# Patient Record
Sex: Male | Born: 1945 | Race: White | Hispanic: No | Marital: Married | Smoking: Former smoker
Health system: Southern US, Community
[De-identification: ages and names within clinical notes are randomized; demographics above are authoritative.]

## PROBLEM LIST (undated history)

## (undated) DIAGNOSIS — M62830 Muscle spasm of back: Secondary | ICD-10-CM

## (undated) HISTORY — PX: KIDNEY STONE SURGERY: SHX686

## (undated) HISTORY — DX: Muscle spasm of back: M62.830

---

## 2000-01-04 DIAGNOSIS — Z860101 Personal history of adenomatous and serrated colon polyps: Secondary | ICD-10-CM

## 2000-01-04 DIAGNOSIS — Z86010 Personal history of colonic polyps: Secondary | ICD-10-CM

## 2000-01-04 HISTORY — DX: Personal history of adenomatous and serrated colon polyps: Z86.0101

## 2000-01-04 HISTORY — DX: Personal history of colonic polyps: Z86.010

## 2000-01-08 DIAGNOSIS — E139 Other specified diabetes mellitus without complications: Secondary | ICD-10-CM

## 2000-01-08 HISTORY — DX: Other specified diabetes mellitus without complications: E13.9

## 2005-09-20 ENCOUNTER — Ambulatory Visit (HOSPITAL_COMMUNITY): Admission: RE | Admit: 2005-09-20 | Discharge: 2005-09-21 | Payer: Self-pay | Admitting: Ophthalmology

## 2011-01-27 DIAGNOSIS — L259 Unspecified contact dermatitis, unspecified cause: Secondary | ICD-10-CM | POA: Diagnosis not present

## 2011-01-27 DIAGNOSIS — L301 Dyshidrosis [pompholyx]: Secondary | ICD-10-CM | POA: Diagnosis not present

## 2011-03-28 DIAGNOSIS — L738 Other specified follicular disorders: Secondary | ICD-10-CM | POA: Diagnosis not present

## 2011-03-28 DIAGNOSIS — L301 Dyshidrosis [pompholyx]: Secondary | ICD-10-CM | POA: Diagnosis not present

## 2011-05-27 DIAGNOSIS — I10 Essential (primary) hypertension: Secondary | ICD-10-CM | POA: Diagnosis not present

## 2011-05-27 DIAGNOSIS — E7800 Pure hypercholesterolemia, unspecified: Secondary | ICD-10-CM | POA: Diagnosis not present

## 2011-05-27 DIAGNOSIS — E119 Type 2 diabetes mellitus without complications: Secondary | ICD-10-CM | POA: Diagnosis not present

## 2011-05-27 DIAGNOSIS — G43009 Migraine without aura, not intractable, without status migrainosus: Secondary | ICD-10-CM | POA: Diagnosis not present

## 2011-07-22 DIAGNOSIS — E119 Type 2 diabetes mellitus without complications: Secondary | ICD-10-CM | POA: Diagnosis not present

## 2011-07-22 DIAGNOSIS — E7800 Pure hypercholesterolemia, unspecified: Secondary | ICD-10-CM | POA: Diagnosis not present

## 2011-08-17 DIAGNOSIS — B351 Tinea unguium: Secondary | ICD-10-CM | POA: Diagnosis not present

## 2011-08-17 DIAGNOSIS — L608 Other nail disorders: Secondary | ICD-10-CM | POA: Diagnosis not present

## 2011-08-17 DIAGNOSIS — E119 Type 2 diabetes mellitus without complications: Secondary | ICD-10-CM | POA: Diagnosis not present

## 2011-10-31 DIAGNOSIS — J069 Acute upper respiratory infection, unspecified: Secondary | ICD-10-CM | POA: Diagnosis not present

## 2012-07-05 DIAGNOSIS — I4949 Other premature depolarization: Secondary | ICD-10-CM | POA: Diagnosis not present

## 2012-07-05 DIAGNOSIS — E7800 Pure hypercholesterolemia, unspecified: Secondary | ICD-10-CM | POA: Diagnosis not present

## 2012-07-05 DIAGNOSIS — I10 Essential (primary) hypertension: Secondary | ICD-10-CM | POA: Diagnosis not present

## 2012-07-05 DIAGNOSIS — Z125 Encounter for screening for malignant neoplasm of prostate: Secondary | ICD-10-CM | POA: Diagnosis not present

## 2012-08-01 DIAGNOSIS — R0789 Other chest pain: Secondary | ICD-10-CM | POA: Diagnosis not present

## 2012-08-01 DIAGNOSIS — I4949 Other premature depolarization: Secondary | ICD-10-CM | POA: Diagnosis not present

## 2012-08-10 DIAGNOSIS — I4949 Other premature depolarization: Secondary | ICD-10-CM | POA: Diagnosis not present

## 2012-08-10 DIAGNOSIS — Z0000 Encounter for general adult medical examination without abnormal findings: Secondary | ICD-10-CM | POA: Diagnosis not present

## 2012-08-22 DIAGNOSIS — I10 Essential (primary) hypertension: Secondary | ICD-10-CM | POA: Diagnosis not present

## 2012-08-22 DIAGNOSIS — Z833 Family history of diabetes mellitus: Secondary | ICD-10-CM | POA: Diagnosis not present

## 2012-08-22 DIAGNOSIS — Z8349 Family history of other endocrine, nutritional and metabolic diseases: Secondary | ICD-10-CM | POA: Diagnosis not present

## 2012-08-22 DIAGNOSIS — H10029 Other mucopurulent conjunctivitis, unspecified eye: Secondary | ICD-10-CM | POA: Diagnosis not present

## 2012-08-22 DIAGNOSIS — Z789 Other specified health status: Secondary | ICD-10-CM | POA: Diagnosis not present

## 2012-08-22 DIAGNOSIS — I4949 Other premature depolarization: Secondary | ICD-10-CM | POA: Diagnosis not present

## 2012-08-22 DIAGNOSIS — E119 Type 2 diabetes mellitus without complications: Secondary | ICD-10-CM | POA: Diagnosis not present

## 2012-08-22 DIAGNOSIS — Z8249 Family history of ischemic heart disease and other diseases of the circulatory system: Secondary | ICD-10-CM | POA: Diagnosis not present

## 2012-08-22 DIAGNOSIS — E785 Hyperlipidemia, unspecified: Secondary | ICD-10-CM | POA: Diagnosis not present

## 2012-09-07 DIAGNOSIS — R011 Cardiac murmur, unspecified: Secondary | ICD-10-CM | POA: Diagnosis not present

## 2012-09-19 DIAGNOSIS — E119 Type 2 diabetes mellitus without complications: Secondary | ICD-10-CM | POA: Diagnosis not present

## 2012-09-19 DIAGNOSIS — I4949 Other premature depolarization: Secondary | ICD-10-CM | POA: Diagnosis not present

## 2012-09-19 DIAGNOSIS — E785 Hyperlipidemia, unspecified: Secondary | ICD-10-CM | POA: Diagnosis not present

## 2012-09-19 DIAGNOSIS — I10 Essential (primary) hypertension: Secondary | ICD-10-CM | POA: Diagnosis not present

## 2012-09-19 DIAGNOSIS — E669 Obesity, unspecified: Secondary | ICD-10-CM | POA: Diagnosis not present

## 2012-09-25 DIAGNOSIS — M75120 Complete rotator cuff tear or rupture of unspecified shoulder, not specified as traumatic: Secondary | ICD-10-CM | POA: Diagnosis not present

## 2012-09-25 DIAGNOSIS — M7500 Adhesive capsulitis of unspecified shoulder: Secondary | ICD-10-CM | POA: Diagnosis not present

## 2012-10-22 DIAGNOSIS — M549 Dorsalgia, unspecified: Secondary | ICD-10-CM | POA: Diagnosis not present

## 2012-10-31 DIAGNOSIS — Z23 Encounter for immunization: Secondary | ICD-10-CM | POA: Diagnosis not present

## 2012-11-12 DIAGNOSIS — M75120 Complete rotator cuff tear or rupture of unspecified shoulder, not specified as traumatic: Secondary | ICD-10-CM | POA: Diagnosis not present

## 2012-11-15 DIAGNOSIS — M19019 Primary osteoarthritis, unspecified shoulder: Secondary | ICD-10-CM | POA: Diagnosis not present

## 2012-11-15 DIAGNOSIS — S43439A Superior glenoid labrum lesion of unspecified shoulder, initial encounter: Secondary | ICD-10-CM | POA: Diagnosis not present

## 2012-11-15 DIAGNOSIS — M75120 Complete rotator cuff tear or rupture of unspecified shoulder, not specified as traumatic: Secondary | ICD-10-CM | POA: Diagnosis not present

## 2012-11-19 DIAGNOSIS — IMO0002 Reserved for concepts with insufficient information to code with codable children: Secondary | ICD-10-CM | POA: Diagnosis not present

## 2012-11-19 DIAGNOSIS — M7500 Adhesive capsulitis of unspecified shoulder: Secondary | ICD-10-CM | POA: Diagnosis not present

## 2012-11-19 DIAGNOSIS — M25519 Pain in unspecified shoulder: Secondary | ICD-10-CM | POA: Diagnosis not present

## 2012-12-12 DIAGNOSIS — R109 Unspecified abdominal pain: Secondary | ICD-10-CM | POA: Diagnosis not present

## 2012-12-12 DIAGNOSIS — N200 Calculus of kidney: Secondary | ICD-10-CM | POA: Diagnosis not present

## 2012-12-12 DIAGNOSIS — R1084 Generalized abdominal pain: Secondary | ICD-10-CM | POA: Diagnosis not present

## 2012-12-12 DIAGNOSIS — R3129 Other microscopic hematuria: Secondary | ICD-10-CM | POA: Diagnosis not present

## 2013-02-20 DIAGNOSIS — E785 Hyperlipidemia, unspecified: Secondary | ICD-10-CM | POA: Diagnosis present

## 2013-02-20 DIAGNOSIS — K8000 Calculus of gallbladder with acute cholecystitis without obstruction: Secondary | ICD-10-CM | POA: Diagnosis not present

## 2013-02-20 DIAGNOSIS — G8918 Other acute postprocedural pain: Secondary | ICD-10-CM | POA: Diagnosis not present

## 2013-02-20 DIAGNOSIS — E119 Type 2 diabetes mellitus without complications: Secondary | ICD-10-CM | POA: Diagnosis not present

## 2013-02-20 DIAGNOSIS — K819 Cholecystitis, unspecified: Secondary | ICD-10-CM | POA: Diagnosis not present

## 2013-02-20 DIAGNOSIS — K55059 Acute (reversible) ischemia of intestine, part and extent unspecified: Secondary | ICD-10-CM | POA: Diagnosis not present

## 2013-02-20 DIAGNOSIS — K8020 Calculus of gallbladder without cholecystitis without obstruction: Secondary | ICD-10-CM | POA: Diagnosis not present

## 2013-02-20 DIAGNOSIS — R112 Nausea with vomiting, unspecified: Secondary | ICD-10-CM | POA: Diagnosis not present

## 2013-02-20 DIAGNOSIS — Z01818 Encounter for other preprocedural examination: Secondary | ICD-10-CM | POA: Diagnosis not present

## 2013-02-20 DIAGNOSIS — K8689 Other specified diseases of pancreas: Secondary | ICD-10-CM | POA: Diagnosis not present

## 2013-02-20 DIAGNOSIS — N039 Chronic nephritic syndrome with unspecified morphologic changes: Secondary | ICD-10-CM | POA: Diagnosis not present

## 2013-02-20 DIAGNOSIS — Z87891 Personal history of nicotine dependence: Secondary | ICD-10-CM | POA: Diagnosis not present

## 2013-02-20 DIAGNOSIS — R9431 Abnormal electrocardiogram [ECG] [EKG]: Secondary | ICD-10-CM | POA: Diagnosis not present

## 2013-02-20 DIAGNOSIS — K8010 Calculus of gallbladder with chronic cholecystitis without obstruction: Secondary | ICD-10-CM | POA: Diagnosis not present

## 2013-02-20 DIAGNOSIS — K859 Acute pancreatitis without necrosis or infection, unspecified: Secondary | ICD-10-CM | POA: Diagnosis not present

## 2013-02-20 DIAGNOSIS — IMO0001 Reserved for inherently not codable concepts without codable children: Secondary | ICD-10-CM | POA: Diagnosis not present

## 2013-02-20 DIAGNOSIS — Z7982 Long term (current) use of aspirin: Secondary | ICD-10-CM | POA: Diagnosis not present

## 2013-02-20 DIAGNOSIS — R17 Unspecified jaundice: Secondary | ICD-10-CM | POA: Diagnosis not present

## 2013-02-20 DIAGNOSIS — N189 Chronic kidney disease, unspecified: Secondary | ICD-10-CM | POA: Diagnosis present

## 2013-02-20 DIAGNOSIS — R1031 Right lower quadrant pain: Secondary | ICD-10-CM | POA: Diagnosis not present

## 2013-02-20 DIAGNOSIS — I10 Essential (primary) hypertension: Secondary | ICD-10-CM | POA: Diagnosis not present

## 2013-02-20 DIAGNOSIS — K810 Acute cholecystitis: Secondary | ICD-10-CM | POA: Diagnosis not present

## 2013-02-20 DIAGNOSIS — K769 Liver disease, unspecified: Secondary | ICD-10-CM | POA: Diagnosis not present

## 2013-02-20 DIAGNOSIS — E1159 Type 2 diabetes mellitus with other circulatory complications: Secondary | ICD-10-CM | POA: Diagnosis not present

## 2013-02-20 DIAGNOSIS — Z79899 Other long term (current) drug therapy: Secondary | ICD-10-CM | POA: Diagnosis not present

## 2013-02-20 DIAGNOSIS — I130 Hypertensive heart and chronic kidney disease with heart failure and stage 1 through stage 4 chronic kidney disease, or unspecified chronic kidney disease: Secondary | ICD-10-CM | POA: Diagnosis not present

## 2013-02-20 DIAGNOSIS — R945 Abnormal results of liver function studies: Secondary | ICD-10-CM | POA: Diagnosis not present

## 2013-02-20 DIAGNOSIS — I1310 Hypertensive heart and chronic kidney disease without heart failure, with stage 1 through stage 4 chronic kidney disease, or unspecified chronic kidney disease: Secondary | ICD-10-CM | POA: Diagnosis not present

## 2013-02-27 DIAGNOSIS — IMO0001 Reserved for inherently not codable concepts without codable children: Secondary | ICD-10-CM | POA: Diagnosis not present

## 2013-02-27 DIAGNOSIS — G5710 Meralgia paresthetica, unspecified lower limb: Secondary | ICD-10-CM | POA: Diagnosis not present

## 2013-03-18 DIAGNOSIS — I10 Essential (primary) hypertension: Secondary | ICD-10-CM | POA: Diagnosis not present

## 2013-03-18 DIAGNOSIS — E785 Hyperlipidemia, unspecified: Secondary | ICD-10-CM | POA: Diagnosis not present

## 2013-03-18 DIAGNOSIS — E119 Type 2 diabetes mellitus without complications: Secondary | ICD-10-CM | POA: Diagnosis not present

## 2013-03-18 DIAGNOSIS — E669 Obesity, unspecified: Secondary | ICD-10-CM | POA: Diagnosis not present

## 2013-04-17 DIAGNOSIS — Z79899 Other long term (current) drug therapy: Secondary | ICD-10-CM | POA: Diagnosis not present

## 2013-04-17 DIAGNOSIS — E7800 Pure hypercholesterolemia, unspecified: Secondary | ICD-10-CM | POA: Diagnosis not present

## 2013-04-17 DIAGNOSIS — IMO0001 Reserved for inherently not codable concepts without codable children: Secondary | ICD-10-CM | POA: Diagnosis not present

## 2013-10-17 DIAGNOSIS — Z23 Encounter for immunization: Secondary | ICD-10-CM | POA: Diagnosis not present

## 2013-10-23 DIAGNOSIS — Z79899 Other long term (current) drug therapy: Secondary | ICD-10-CM | POA: Diagnosis not present

## 2013-10-23 DIAGNOSIS — E780 Pure hypercholesterolemia: Secondary | ICD-10-CM | POA: Diagnosis not present

## 2013-10-23 DIAGNOSIS — I10 Essential (primary) hypertension: Secondary | ICD-10-CM | POA: Diagnosis not present

## 2013-10-23 DIAGNOSIS — E1165 Type 2 diabetes mellitus with hyperglycemia: Secondary | ICD-10-CM | POA: Diagnosis not present

## 2013-10-29 DIAGNOSIS — E119 Type 2 diabetes mellitus without complications: Secondary | ICD-10-CM | POA: Diagnosis not present

## 2013-10-29 DIAGNOSIS — E785 Hyperlipidemia, unspecified: Secondary | ICD-10-CM | POA: Diagnosis not present

## 2013-10-29 DIAGNOSIS — E669 Obesity, unspecified: Secondary | ICD-10-CM | POA: Diagnosis not present

## 2013-10-29 DIAGNOSIS — I10 Essential (primary) hypertension: Secondary | ICD-10-CM | POA: Diagnosis not present

## 2013-11-05 DIAGNOSIS — I10 Essential (primary) hypertension: Secondary | ICD-10-CM | POA: Diagnosis not present

## 2013-12-11 DIAGNOSIS — M7500 Adhesive capsulitis of unspecified shoulder: Secondary | ICD-10-CM | POA: Diagnosis not present

## 2014-08-21 DIAGNOSIS — I10 Essential (primary) hypertension: Secondary | ICD-10-CM | POA: Diagnosis not present

## 2014-08-21 DIAGNOSIS — G43009 Migraine without aura, not intractable, without status migrainosus: Secondary | ICD-10-CM | POA: Diagnosis not present

## 2014-08-21 DIAGNOSIS — E1165 Type 2 diabetes mellitus with hyperglycemia: Secondary | ICD-10-CM | POA: Diagnosis not present

## 2014-08-21 DIAGNOSIS — E780 Pure hypercholesterolemia: Secondary | ICD-10-CM | POA: Diagnosis not present

## 2014-10-22 DIAGNOSIS — M24571 Contracture, right ankle: Secondary | ICD-10-CM | POA: Diagnosis not present

## 2014-10-22 DIAGNOSIS — M7742 Metatarsalgia, left foot: Secondary | ICD-10-CM | POA: Diagnosis not present

## 2014-10-22 DIAGNOSIS — M2042 Other hammer toe(s) (acquired), left foot: Secondary | ICD-10-CM | POA: Diagnosis not present

## 2014-10-22 DIAGNOSIS — M7741 Metatarsalgia, right foot: Secondary | ICD-10-CM | POA: Diagnosis not present

## 2014-10-22 DIAGNOSIS — L84 Corns and callosities: Secondary | ICD-10-CM | POA: Diagnosis not present

## 2014-10-22 DIAGNOSIS — M2041 Other hammer toe(s) (acquired), right foot: Secondary | ICD-10-CM | POA: Diagnosis not present

## 2014-10-22 DIAGNOSIS — E1140 Type 2 diabetes mellitus with diabetic neuropathy, unspecified: Secondary | ICD-10-CM | POA: Diagnosis not present

## 2014-10-22 DIAGNOSIS — M24572 Contracture, left ankle: Secondary | ICD-10-CM | POA: Diagnosis not present

## 2014-10-27 DIAGNOSIS — I499 Cardiac arrhythmia, unspecified: Secondary | ICD-10-CM | POA: Diagnosis not present

## 2014-10-27 DIAGNOSIS — I493 Ventricular premature depolarization: Secondary | ICD-10-CM | POA: Diagnosis not present

## 2014-10-27 DIAGNOSIS — E119 Type 2 diabetes mellitus without complications: Secondary | ICD-10-CM | POA: Diagnosis not present

## 2014-10-27 DIAGNOSIS — I10 Essential (primary) hypertension: Secondary | ICD-10-CM | POA: Diagnosis not present

## 2014-11-04 DIAGNOSIS — Z23 Encounter for immunization: Secondary | ICD-10-CM | POA: Diagnosis not present

## 2014-11-05 DIAGNOSIS — M7502 Adhesive capsulitis of left shoulder: Secondary | ICD-10-CM | POA: Diagnosis not present

## 2014-12-26 DIAGNOSIS — J00 Acute nasopharyngitis [common cold]: Secondary | ICD-10-CM | POA: Diagnosis not present

## 2014-12-26 DIAGNOSIS — J209 Acute bronchitis, unspecified: Secondary | ICD-10-CM | POA: Diagnosis not present

## 2015-05-29 DIAGNOSIS — L219 Seborrheic dermatitis, unspecified: Secondary | ICD-10-CM | POA: Diagnosis not present

## 2015-05-29 DIAGNOSIS — R233 Spontaneous ecchymoses: Secondary | ICD-10-CM | POA: Diagnosis not present

## 2015-06-05 DIAGNOSIS — R42 Dizziness and giddiness: Secondary | ICD-10-CM | POA: Diagnosis not present

## 2015-07-08 DIAGNOSIS — H66009 Acute suppurative otitis media without spontaneous rupture of ear drum, unspecified ear: Secondary | ICD-10-CM | POA: Diagnosis not present

## 2015-07-14 DIAGNOSIS — H6992 Unspecified Eustachian tube disorder, left ear: Secondary | ICD-10-CM | POA: Diagnosis not present

## 2015-07-14 DIAGNOSIS — I951 Orthostatic hypotension: Secondary | ICD-10-CM | POA: Diagnosis not present

## 2015-09-04 DIAGNOSIS — G43009 Migraine without aura, not intractable, without status migrainosus: Secondary | ICD-10-CM | POA: Diagnosis not present

## 2015-09-04 DIAGNOSIS — Z0000 Encounter for general adult medical examination without abnormal findings: Secondary | ICD-10-CM | POA: Diagnosis not present

## 2015-09-04 DIAGNOSIS — E1165 Type 2 diabetes mellitus with hyperglycemia: Secondary | ICD-10-CM | POA: Diagnosis not present

## 2015-09-04 DIAGNOSIS — Z125 Encounter for screening for malignant neoplasm of prostate: Secondary | ICD-10-CM | POA: Diagnosis not present

## 2015-09-04 DIAGNOSIS — I10 Essential (primary) hypertension: Secondary | ICD-10-CM | POA: Diagnosis not present

## 2015-09-04 DIAGNOSIS — E785 Hyperlipidemia, unspecified: Secondary | ICD-10-CM | POA: Diagnosis not present

## 2015-09-04 DIAGNOSIS — Z79899 Other long term (current) drug therapy: Secondary | ICD-10-CM | POA: Diagnosis not present

## 2015-11-18 DIAGNOSIS — Z23 Encounter for immunization: Secondary | ICD-10-CM | POA: Diagnosis not present

## 2016-01-08 DIAGNOSIS — R008 Other abnormalities of heart beat: Secondary | ICD-10-CM | POA: Insufficient documentation

## 2016-01-08 HISTORY — DX: Other abnormalities of heart beat: R00.8

## 2016-02-12 DIAGNOSIS — R100 Acute abdomen: Secondary | ICD-10-CM | POA: Diagnosis not present

## 2016-02-12 DIAGNOSIS — N200 Calculus of kidney: Secondary | ICD-10-CM | POA: Diagnosis not present

## 2016-02-12 DIAGNOSIS — R311 Benign essential microscopic hematuria: Secondary | ICD-10-CM | POA: Diagnosis not present

## 2016-02-12 DIAGNOSIS — N3001 Acute cystitis with hematuria: Secondary | ICD-10-CM | POA: Diagnosis not present

## 2016-02-12 DIAGNOSIS — N401 Enlarged prostate with lower urinary tract symptoms: Secondary | ICD-10-CM | POA: Diagnosis not present

## 2016-02-23 DIAGNOSIS — Z01818 Encounter for other preprocedural examination: Secondary | ICD-10-CM | POA: Diagnosis not present

## 2016-02-23 DIAGNOSIS — Z9181 History of falling: Secondary | ICD-10-CM | POA: Diagnosis not present

## 2016-02-23 DIAGNOSIS — Z01810 Encounter for preprocedural cardiovascular examination: Secondary | ICD-10-CM | POA: Diagnosis not present

## 2016-02-23 DIAGNOSIS — Z1389 Encounter for screening for other disorder: Secondary | ICD-10-CM | POA: Diagnosis not present

## 2016-02-24 DIAGNOSIS — I2510 Atherosclerotic heart disease of native coronary artery without angina pectoris: Secondary | ICD-10-CM | POA: Diagnosis not present

## 2016-02-24 DIAGNOSIS — E119 Type 2 diabetes mellitus without complications: Secondary | ICD-10-CM | POA: Diagnosis not present

## 2016-02-24 DIAGNOSIS — Z79899 Other long term (current) drug therapy: Secondary | ICD-10-CM | POA: Diagnosis not present

## 2016-02-24 DIAGNOSIS — N3001 Acute cystitis with hematuria: Secondary | ICD-10-CM | POA: Diagnosis not present

## 2016-02-24 DIAGNOSIS — N200 Calculus of kidney: Secondary | ICD-10-CM | POA: Diagnosis not present

## 2016-02-24 DIAGNOSIS — Z7982 Long term (current) use of aspirin: Secondary | ICD-10-CM | POA: Diagnosis not present

## 2016-02-24 DIAGNOSIS — R311 Benign essential microscopic hematuria: Secondary | ICD-10-CM | POA: Diagnosis not present

## 2016-02-24 DIAGNOSIS — I10 Essential (primary) hypertension: Secondary | ICD-10-CM | POA: Diagnosis not present

## 2016-02-24 DIAGNOSIS — R100 Acute abdomen: Secondary | ICD-10-CM | POA: Diagnosis not present

## 2016-02-26 DIAGNOSIS — I499 Cardiac arrhythmia, unspecified: Secondary | ICD-10-CM | POA: Diagnosis not present

## 2016-02-26 DIAGNOSIS — E119 Type 2 diabetes mellitus without complications: Secondary | ICD-10-CM | POA: Diagnosis not present

## 2016-02-26 DIAGNOSIS — I10 Essential (primary) hypertension: Secondary | ICD-10-CM | POA: Diagnosis not present

## 2016-02-26 DIAGNOSIS — N200 Calculus of kidney: Secondary | ICD-10-CM | POA: Diagnosis not present

## 2016-03-04 DIAGNOSIS — N200 Calculus of kidney: Secondary | ICD-10-CM | POA: Diagnosis not present

## 2016-03-04 DIAGNOSIS — N3020 Other chronic cystitis without hematuria: Secondary | ICD-10-CM | POA: Diagnosis not present

## 2016-04-05 DIAGNOSIS — N200 Calculus of kidney: Secondary | ICD-10-CM | POA: Diagnosis not present

## 2016-04-05 DIAGNOSIS — N3020 Other chronic cystitis without hematuria: Secondary | ICD-10-CM | POA: Diagnosis not present

## 2016-04-07 DIAGNOSIS — I499 Cardiac arrhythmia, unspecified: Secondary | ICD-10-CM | POA: Diagnosis not present

## 2016-04-07 DIAGNOSIS — E119 Type 2 diabetes mellitus without complications: Secondary | ICD-10-CM | POA: Diagnosis not present

## 2016-04-07 DIAGNOSIS — N200 Calculus of kidney: Secondary | ICD-10-CM | POA: Diagnosis not present

## 2016-04-07 DIAGNOSIS — I10 Essential (primary) hypertension: Secondary | ICD-10-CM | POA: Diagnosis not present

## 2016-04-07 DIAGNOSIS — R001 Bradycardia, unspecified: Secondary | ICD-10-CM | POA: Diagnosis not present

## 2016-04-08 DIAGNOSIS — N200 Calculus of kidney: Secondary | ICD-10-CM | POA: Diagnosis not present

## 2016-04-08 DIAGNOSIS — I10 Essential (primary) hypertension: Secondary | ICD-10-CM | POA: Diagnosis not present

## 2016-04-08 DIAGNOSIS — E119 Type 2 diabetes mellitus without complications: Secondary | ICD-10-CM | POA: Diagnosis not present

## 2016-04-08 DIAGNOSIS — I499 Cardiac arrhythmia, unspecified: Secondary | ICD-10-CM | POA: Diagnosis not present

## 2016-04-15 DIAGNOSIS — N200 Calculus of kidney: Secondary | ICD-10-CM | POA: Diagnosis not present

## 2016-04-15 DIAGNOSIS — N3020 Other chronic cystitis without hematuria: Secondary | ICD-10-CM | POA: Diagnosis not present

## 2016-05-05 DIAGNOSIS — N3020 Other chronic cystitis without hematuria: Secondary | ICD-10-CM | POA: Diagnosis not present

## 2016-05-05 DIAGNOSIS — N200 Calculus of kidney: Secondary | ICD-10-CM | POA: Diagnosis not present

## 2016-05-31 DIAGNOSIS — Z6830 Body mass index (BMI) 30.0-30.9, adult: Secondary | ICD-10-CM | POA: Diagnosis not present

## 2016-05-31 DIAGNOSIS — R21 Rash and other nonspecific skin eruption: Secondary | ICD-10-CM | POA: Diagnosis not present

## 2016-05-31 DIAGNOSIS — W57XXXA Bitten or stung by nonvenomous insect and other nonvenomous arthropods, initial encounter: Secondary | ICD-10-CM | POA: Diagnosis not present

## 2016-07-04 DIAGNOSIS — N200 Calculus of kidney: Secondary | ICD-10-CM | POA: Diagnosis not present

## 2016-07-04 DIAGNOSIS — N3020 Other chronic cystitis without hematuria: Secondary | ICD-10-CM | POA: Diagnosis not present

## 2016-10-14 DIAGNOSIS — Z23 Encounter for immunization: Secondary | ICD-10-CM | POA: Diagnosis not present

## 2016-11-09 DIAGNOSIS — M549 Dorsalgia, unspecified: Secondary | ICD-10-CM | POA: Diagnosis not present

## 2016-12-07 DIAGNOSIS — Z6832 Body mass index (BMI) 32.0-32.9, adult: Secondary | ICD-10-CM | POA: Diagnosis not present

## 2016-12-07 DIAGNOSIS — M545 Low back pain: Secondary | ICD-10-CM | POA: Diagnosis not present

## 2017-01-02 DIAGNOSIS — Z1331 Encounter for screening for depression: Secondary | ICD-10-CM | POA: Diagnosis not present

## 2017-01-02 DIAGNOSIS — Z6832 Body mass index (BMI) 32.0-32.9, adult: Secondary | ICD-10-CM | POA: Diagnosis not present

## 2017-01-02 DIAGNOSIS — M545 Low back pain: Secondary | ICD-10-CM | POA: Diagnosis not present

## 2017-01-02 DIAGNOSIS — G8929 Other chronic pain: Secondary | ICD-10-CM | POA: Diagnosis not present

## 2017-01-10 DIAGNOSIS — N200 Calculus of kidney: Secondary | ICD-10-CM | POA: Diagnosis not present

## 2017-02-07 DIAGNOSIS — G8929 Other chronic pain: Secondary | ICD-10-CM | POA: Diagnosis not present

## 2017-02-07 DIAGNOSIS — Z87442 Personal history of urinary calculi: Secondary | ICD-10-CM | POA: Diagnosis not present

## 2017-02-07 DIAGNOSIS — M545 Low back pain: Secondary | ICD-10-CM | POA: Diagnosis not present

## 2017-02-07 DIAGNOSIS — R319 Hematuria, unspecified: Secondary | ICD-10-CM | POA: Diagnosis not present

## 2017-02-14 DIAGNOSIS — R2689 Other abnormalities of gait and mobility: Secondary | ICD-10-CM | POA: Diagnosis not present

## 2017-02-14 DIAGNOSIS — R293 Abnormal posture: Secondary | ICD-10-CM | POA: Diagnosis not present

## 2017-02-14 DIAGNOSIS — M2560 Stiffness of unspecified joint, not elsewhere classified: Secondary | ICD-10-CM | POA: Diagnosis not present

## 2017-02-14 DIAGNOSIS — M6281 Muscle weakness (generalized): Secondary | ICD-10-CM | POA: Diagnosis not present

## 2017-02-14 DIAGNOSIS — M545 Low back pain: Secondary | ICD-10-CM | POA: Diagnosis not present

## 2017-02-17 DIAGNOSIS — M6281 Muscle weakness (generalized): Secondary | ICD-10-CM | POA: Diagnosis not present

## 2017-02-17 DIAGNOSIS — R293 Abnormal posture: Secondary | ICD-10-CM | POA: Diagnosis not present

## 2017-02-17 DIAGNOSIS — M2560 Stiffness of unspecified joint, not elsewhere classified: Secondary | ICD-10-CM | POA: Diagnosis not present

## 2017-02-17 DIAGNOSIS — M545 Low back pain: Secondary | ICD-10-CM | POA: Diagnosis not present

## 2017-02-17 DIAGNOSIS — R2689 Other abnormalities of gait and mobility: Secondary | ICD-10-CM | POA: Diagnosis not present

## 2017-02-21 DIAGNOSIS — R293 Abnormal posture: Secondary | ICD-10-CM | POA: Diagnosis not present

## 2017-02-21 DIAGNOSIS — M2560 Stiffness of unspecified joint, not elsewhere classified: Secondary | ICD-10-CM | POA: Diagnosis not present

## 2017-02-21 DIAGNOSIS — M545 Low back pain: Secondary | ICD-10-CM | POA: Diagnosis not present

## 2017-02-21 DIAGNOSIS — R2689 Other abnormalities of gait and mobility: Secondary | ICD-10-CM | POA: Diagnosis not present

## 2017-02-21 DIAGNOSIS — M6281 Muscle weakness (generalized): Secondary | ICD-10-CM | POA: Diagnosis not present

## 2017-02-23 DIAGNOSIS — M6281 Muscle weakness (generalized): Secondary | ICD-10-CM | POA: Diagnosis not present

## 2017-02-23 DIAGNOSIS — R2689 Other abnormalities of gait and mobility: Secondary | ICD-10-CM | POA: Diagnosis not present

## 2017-02-23 DIAGNOSIS — M2560 Stiffness of unspecified joint, not elsewhere classified: Secondary | ICD-10-CM | POA: Diagnosis not present

## 2017-02-23 DIAGNOSIS — M545 Low back pain: Secondary | ICD-10-CM | POA: Diagnosis not present

## 2017-02-23 DIAGNOSIS — R293 Abnormal posture: Secondary | ICD-10-CM | POA: Diagnosis not present

## 2017-02-28 DIAGNOSIS — R2689 Other abnormalities of gait and mobility: Secondary | ICD-10-CM | POA: Diagnosis not present

## 2017-02-28 DIAGNOSIS — M545 Low back pain: Secondary | ICD-10-CM | POA: Diagnosis not present

## 2017-02-28 DIAGNOSIS — R293 Abnormal posture: Secondary | ICD-10-CM | POA: Diagnosis not present

## 2017-02-28 DIAGNOSIS — M6281 Muscle weakness (generalized): Secondary | ICD-10-CM | POA: Diagnosis not present

## 2017-02-28 DIAGNOSIS — M2560 Stiffness of unspecified joint, not elsewhere classified: Secondary | ICD-10-CM | POA: Diagnosis not present

## 2017-03-02 DIAGNOSIS — M545 Low back pain: Secondary | ICD-10-CM | POA: Diagnosis not present

## 2017-03-02 DIAGNOSIS — M6281 Muscle weakness (generalized): Secondary | ICD-10-CM | POA: Diagnosis not present

## 2017-03-02 DIAGNOSIS — M2560 Stiffness of unspecified joint, not elsewhere classified: Secondary | ICD-10-CM | POA: Diagnosis not present

## 2017-03-02 DIAGNOSIS — R293 Abnormal posture: Secondary | ICD-10-CM | POA: Diagnosis not present

## 2017-03-02 DIAGNOSIS — R2689 Other abnormalities of gait and mobility: Secondary | ICD-10-CM | POA: Diagnosis not present

## 2017-03-06 DIAGNOSIS — R2689 Other abnormalities of gait and mobility: Secondary | ICD-10-CM | POA: Diagnosis not present

## 2017-03-06 DIAGNOSIS — M6281 Muscle weakness (generalized): Secondary | ICD-10-CM | POA: Diagnosis not present

## 2017-03-06 DIAGNOSIS — R293 Abnormal posture: Secondary | ICD-10-CM | POA: Diagnosis not present

## 2017-03-06 DIAGNOSIS — M2560 Stiffness of unspecified joint, not elsewhere classified: Secondary | ICD-10-CM | POA: Diagnosis not present

## 2017-03-06 DIAGNOSIS — M545 Low back pain: Secondary | ICD-10-CM | POA: Diagnosis not present

## 2017-03-08 DIAGNOSIS — I493 Ventricular premature depolarization: Secondary | ICD-10-CM

## 2017-03-08 DIAGNOSIS — E119 Type 2 diabetes mellitus without complications: Secondary | ICD-10-CM | POA: Insufficient documentation

## 2017-03-08 DIAGNOSIS — E785 Hyperlipidemia, unspecified: Secondary | ICD-10-CM

## 2017-03-08 DIAGNOSIS — I10 Essential (primary) hypertension: Secondary | ICD-10-CM | POA: Insufficient documentation

## 2017-03-08 HISTORY — DX: Type 2 diabetes mellitus without complications: E11.9

## 2017-03-08 HISTORY — DX: Essential (primary) hypertension: I10

## 2017-03-08 HISTORY — DX: Ventricular premature depolarization: I49.3

## 2017-03-08 HISTORY — DX: Hyperlipidemia, unspecified: E78.5

## 2017-03-09 ENCOUNTER — Ambulatory Visit (INDEPENDENT_AMBULATORY_CARE_PROVIDER_SITE_OTHER): Payer: Medicare Other | Admitting: Cardiology

## 2017-03-09 ENCOUNTER — Encounter: Payer: Self-pay | Admitting: Cardiology

## 2017-03-09 VITALS — BP 152/90 | HR 84 | Ht 72.0 in | Wt 255.4 lb

## 2017-03-09 DIAGNOSIS — R0600 Dyspnea, unspecified: Secondary | ICD-10-CM

## 2017-03-09 DIAGNOSIS — M6281 Muscle weakness (generalized): Secondary | ICD-10-CM | POA: Diagnosis not present

## 2017-03-09 DIAGNOSIS — I10 Essential (primary) hypertension: Secondary | ICD-10-CM | POA: Diagnosis not present

## 2017-03-09 DIAGNOSIS — M2560 Stiffness of unspecified joint, not elsewhere classified: Secondary | ICD-10-CM | POA: Diagnosis not present

## 2017-03-09 DIAGNOSIS — R0609 Other forms of dyspnea: Secondary | ICD-10-CM | POA: Insufficient documentation

## 2017-03-09 DIAGNOSIS — I493 Ventricular premature depolarization: Secondary | ICD-10-CM | POA: Diagnosis not present

## 2017-03-09 DIAGNOSIS — E119 Type 2 diabetes mellitus without complications: Secondary | ICD-10-CM

## 2017-03-09 DIAGNOSIS — M545 Low back pain: Secondary | ICD-10-CM | POA: Diagnosis not present

## 2017-03-09 DIAGNOSIS — E785 Hyperlipidemia, unspecified: Secondary | ICD-10-CM | POA: Diagnosis not present

## 2017-03-09 DIAGNOSIS — R2689 Other abnormalities of gait and mobility: Secondary | ICD-10-CM | POA: Diagnosis not present

## 2017-03-09 DIAGNOSIS — R293 Abnormal posture: Secondary | ICD-10-CM | POA: Diagnosis not present

## 2017-03-09 HISTORY — DX: Other forms of dyspnea: R06.09

## 2017-03-09 HISTORY — DX: Dyspnea, unspecified: R06.00

## 2017-03-09 MED ORDER — CARVEDILOL 3.125 MG PO TABS: 3.1250 mg | ORAL_TABLET | Freq: Two times a day (BID) | ORAL | 0 refills | Status: DC

## 2017-03-09 MED ORDER — CARVEDILOL 3.125 MG PO TABS: 3.1250 mg | ORAL_TABLET | Freq: Two times a day (BID) | ORAL | 3 refills | Status: DC

## 2017-03-09 NOTE — Addendum Note (Signed)
Addended by: Ayesha MohairWELLS, Roslynn Holte E on: 03/09/2017 02:49 PM   Modules accepted: Orders

## 2017-03-09 NOTE — Patient Instructions (Signed)
Medication Instructions:  Your physician recommends that you continue on your current medications as directed. Please refer to the Current Medication list given to you today.  Labwork: Your physician recommends that you return for lab work on the same day as your stress echo. BMP, CBC, TSH, LFT, Lipid panel.  Testing/Procedures: You had an EKG today.  Your physician has requested that you have a stress echocardiogram. For further information please visit [email or web address]. Please follow instruction sheet as given.  Follow-Up: Your physician wants you to follow-up in: 6 months. You will receive a reminder letter in the mail two months in advance. If you don't receive a letter, please call our office to schedule the follow-up appointment.  Any Other Special Instructions Will Be Listed Below (If Applicable).     If you need a refill on your cardiac medications before your next appointment, please call your pharmacy.

## 2017-03-09 NOTE — Progress Notes (Signed)
Cardiology Office Note:    Date:  03/09/2017   ID:  Christian Hall, DOB [birth year], MRN [id number]  PCP:  Lise Auer, MD  Cardiologist:  Garwin Brothers, MD   Referring MD: No ref. provider found    ASSESSMENT:    1. Dyspnea on exertion   2. PVC's (premature ventricular contractions)   3. Hyperlipidemia, unspecified hyperlipidemia type   4. Benign essential hypertension   5. Ventricular premature contractions   6. Diabetes mellitus without complication (HCC)    PLAN:    In order of problems listed above:  1. In view of the above and multiple risk factors for coronary artery disease I suggested exercise stress echo and is agreeable.  He has not taken his carvedilol as he has run out of it and we will restart this.  He will be back in the next few days for an exercise stress echo.  I told him that it would be okay for him to restart his carvedilol and take it on the morning of this test.  Lipids are followed by primary care physician.  He will have blood work when he comes next for the stress echo and this will include fasting lipids. 2. Patient will be seen in follow-up appointment in 6 months or earlier if the patient has any concerns 3. I reassured him about his PVCs.   Medication Adjustments/Labs and Tests Ordered: Current medicines are reviewed at length with the patient today.  Concerns regarding medicines are outlined above.  Orders Placed This Encounter  Procedures  . Basic metabolic panel  . CBC  . TSH  . Hepatic function panel  . Lipid Profile  . EKG 12-Lead  . ECHOCARDIOGRAM STRESS TEST   Meds ordered this encounter  Medications  . carvedilol (COREG) 3.125 MG tablet    Sig: Take 1 tablet (3.125 mg total) by mouth 2 (two) times daily.    Dispense:  180 tablet    Refill:  3     History of Present Illness:    Christian Hall is a 72 y.o. male who is being seen today for the evaluation of essential hypertension and shortness of breath.  Patient is a  pleasant 72 year old male with past medical history of essential hypertension, dyslipidemia and diabetes mellitus.  He has not been seen by me in this practice.  He is completed to get established.  He denies any chest pain orthopnea or PND.  He walks on a regular basis but wants to be evaluated because of frequent PVCs in the past.  He has also some shortness of breath on exertion at times.  No orthopnea or PND.  At the time of my evaluation, the patient is alert awake oriented and in no distress.  Past Medical History:  Diagnosis Date  . Muscle spasm of back     Past Surgical History:  Procedure Laterality Date  . KIDNEY STONE SURGERY      Current Medications: Current Meds  Medication Sig  . aspirin EC 325 MG tablet Take 325 mg by mouth.  . carvedilol (COREG) 3.125 MG tablet Take 1 tablet (3.125 mg total) by mouth 2 (two) times daily.  Marland Kitchen glimepiride (AMARYL) 4 MG tablet Take 4 mg by mouth.  . simvastatin (ZOCOR) 40 MG tablet Take 40 mg by mouth.  . sitaGLIPtin-metformin (JANUMET) 50-1000 MG tablet Take by mouth.  . topiramate (TOPAMAX) 100 MG tablet   . [DISCONTINUED] carvedilol (COREG) 3.125 MG tablet TAKE 1 TABLET TWICE A DAY  . [  DISCONTINUED] ciprofloxacin (CIPRO) 250 MG tablet   . [DISCONTINUED] lisinopril (PRINIVIL,ZESTRIL) 40 MG tablet Take 40 mg by mouth.     Allergies:   Patient has no known allergies.   Social History   Socioeconomic History  . Marital status: Married    Spouse name: None  . Number of children: None  . Years of education: None  . Highest education level: None  Social Needs  . Financial resource strain: None  . Food insecurity - worry: None  . Food insecurity - inability: None  . Transportation needs - medical: None  . Transportation needs - non-medical: None  Occupational History  . None  Tobacco Use  . Smoking status: Former Games developer  . Smokeless tobacco: Never Used  Substance and Sexual Activity  . Alcohol use: None  . Drug use: None  .  Sexual activity: None  Other Topics Concern  . None  Social History Narrative  . None     Family History: The patient's family history is not on file.  ROS:   Please see the history of present illness.    All other systems reviewed and are negative.  EKGs/Labs/Other Studies Reviewed:    The following studies were reviewed today: I reviewed the EKG and this revealed sinus rhythm with PVCs.  He is known to have frequent PVCs in the past.   Recent Labs: No results found for requested labs within last 8760 hours.  Recent Lipid Panel No results found for: CHOL, TRIG, HDL, CHOLHDL, VLDL, LDLCALC, LDLDIRECT  Physical Exam:    VS:  BP (!) 152/90 (BP Location: Left Arm, Patient Position: Sitting, Cuff Size: Normal)   Pulse 84   Ht 6' (1.829 m)   Wt 255 lb 6.4 oz (115.8 kg)   SpO2 98%   BMI 34.64 kg/m     Wt Readings from Last 3 Encounters:  03/09/17 255 lb 6.4 oz (115.8 kg)     GEN: Patient is in no acute distress HEENT: Normal NECK: No JVD; No carotid bruits LYMPHATICS: No lymphadenopathy CARDIAC: S1 S2 regular, 2/6 systolic murmur at the apex. RESPIRATORY:  Clear to auscultation without rales, wheezing or rhonchi  ABDOMEN: Soft, non-tender, non-distended MUSCULOSKELETAL:  No edema; No deformity  SKIN: Warm and dry NEUROLOGIC:  Alert and oriented x 3 PSYCHIATRIC:  Normal affect    Signed, Garwin Brothers, MD  03/09/2017 12:05 PM    [facility]

## 2017-03-13 DIAGNOSIS — R2689 Other abnormalities of gait and mobility: Secondary | ICD-10-CM | POA: Diagnosis not present

## 2017-03-13 DIAGNOSIS — M6281 Muscle weakness (generalized): Secondary | ICD-10-CM | POA: Diagnosis not present

## 2017-03-13 DIAGNOSIS — M2560 Stiffness of unspecified joint, not elsewhere classified: Secondary | ICD-10-CM | POA: Diagnosis not present

## 2017-03-13 DIAGNOSIS — R293 Abnormal posture: Secondary | ICD-10-CM | POA: Diagnosis not present

## 2017-03-13 DIAGNOSIS — M545 Low back pain: Secondary | ICD-10-CM | POA: Diagnosis not present

## 2017-03-20 DIAGNOSIS — R293 Abnormal posture: Secondary | ICD-10-CM | POA: Diagnosis not present

## 2017-03-20 DIAGNOSIS — M545 Low back pain: Secondary | ICD-10-CM | POA: Diagnosis not present

## 2017-03-20 DIAGNOSIS — M2560 Stiffness of unspecified joint, not elsewhere classified: Secondary | ICD-10-CM | POA: Diagnosis not present

## 2017-03-20 DIAGNOSIS — M6281 Muscle weakness (generalized): Secondary | ICD-10-CM | POA: Diagnosis not present

## 2017-03-20 DIAGNOSIS — R2689 Other abnormalities of gait and mobility: Secondary | ICD-10-CM | POA: Diagnosis not present

## 2017-03-23 DIAGNOSIS — M545 Low back pain: Secondary | ICD-10-CM | POA: Diagnosis not present

## 2017-03-23 DIAGNOSIS — R2689 Other abnormalities of gait and mobility: Secondary | ICD-10-CM | POA: Diagnosis not present

## 2017-03-23 DIAGNOSIS — M6281 Muscle weakness (generalized): Secondary | ICD-10-CM | POA: Diagnosis not present

## 2017-03-23 DIAGNOSIS — M2560 Stiffness of unspecified joint, not elsewhere classified: Secondary | ICD-10-CM | POA: Diagnosis not present

## 2017-03-23 DIAGNOSIS — R293 Abnormal posture: Secondary | ICD-10-CM | POA: Diagnosis not present

## 2017-03-27 DIAGNOSIS — M545 Low back pain: Secondary | ICD-10-CM | POA: Diagnosis not present

## 2017-03-27 DIAGNOSIS — M2560 Stiffness of unspecified joint, not elsewhere classified: Secondary | ICD-10-CM | POA: Diagnosis not present

## 2017-03-27 DIAGNOSIS — R2689 Other abnormalities of gait and mobility: Secondary | ICD-10-CM | POA: Diagnosis not present

## 2017-03-27 DIAGNOSIS — M6281 Muscle weakness (generalized): Secondary | ICD-10-CM | POA: Diagnosis not present

## 2017-03-27 DIAGNOSIS — R293 Abnormal posture: Secondary | ICD-10-CM | POA: Diagnosis not present

## 2017-03-30 DIAGNOSIS — M6281 Muscle weakness (generalized): Secondary | ICD-10-CM | POA: Diagnosis not present

## 2017-03-30 DIAGNOSIS — M2560 Stiffness of unspecified joint, not elsewhere classified: Secondary | ICD-10-CM | POA: Diagnosis not present

## 2017-03-30 DIAGNOSIS — R2689 Other abnormalities of gait and mobility: Secondary | ICD-10-CM | POA: Diagnosis not present

## 2017-03-30 DIAGNOSIS — M545 Low back pain: Secondary | ICD-10-CM | POA: Diagnosis not present

## 2017-03-30 DIAGNOSIS — R293 Abnormal posture: Secondary | ICD-10-CM | POA: Diagnosis not present

## 2017-04-03 DIAGNOSIS — M545 Low back pain: Secondary | ICD-10-CM | POA: Diagnosis not present

## 2017-04-06 DIAGNOSIS — M545 Low back pain: Secondary | ICD-10-CM | POA: Diagnosis not present

## 2017-04-10 ENCOUNTER — Ambulatory Visit (HOSPITAL_BASED_OUTPATIENT_CLINIC_OR_DEPARTMENT_OTHER)
Admission: RE | Admit: 2017-04-10 | Discharge: 2017-04-10 | Disposition: A | Discharge status: Home / Self Care | Payer: Medicare Other | Source: Ambulatory Visit | Attending: Cardiology | Admitting: Cardiology

## 2017-04-10 DIAGNOSIS — R0609 Other forms of dyspnea: Secondary | ICD-10-CM | POA: Diagnosis not present

## 2017-04-10 DIAGNOSIS — I493 Ventricular premature depolarization: Secondary | ICD-10-CM

## 2017-04-10 DIAGNOSIS — E785 Hyperlipidemia, unspecified: Secondary | ICD-10-CM | POA: Insufficient documentation

## 2017-04-10 DIAGNOSIS — I10 Essential (primary) hypertension: Secondary | ICD-10-CM | POA: Diagnosis not present

## 2017-04-10 NOTE — Progress Notes (Signed)
Echocardiogram Echocardiogram Stress Test has been performed.  Dorothey BasemanReel, Mikah Rottinghaus M 04/10/2017, 11:31 AM

## 2017-04-11 DIAGNOSIS — M545 Low back pain: Secondary | ICD-10-CM | POA: Diagnosis not present

## 2017-04-13 DIAGNOSIS — M545 Low back pain: Secondary | ICD-10-CM | POA: Diagnosis not present

## 2017-04-17 DIAGNOSIS — M545 Low back pain: Secondary | ICD-10-CM | POA: Diagnosis not present

## 2017-04-19 DIAGNOSIS — M545 Low back pain: Secondary | ICD-10-CM | POA: Diagnosis not present

## 2017-04-25 DIAGNOSIS — M545 Low back pain: Secondary | ICD-10-CM | POA: Diagnosis not present

## 2017-04-28 DIAGNOSIS — M545 Low back pain: Secondary | ICD-10-CM | POA: Diagnosis not present

## 2017-05-26 ENCOUNTER — Telehealth: Payer: Self-pay

## 2017-05-26 NOTE — Telephone Encounter (Signed)
Called patient regarding overdue labs and patient states had labs drawn at [facility] hospital so will bring in next week at his convenience.

## 2017-06-06 DIAGNOSIS — J40 Bronchitis, not specified as acute or chronic: Secondary | ICD-10-CM | POA: Diagnosis not present

## 2017-06-06 DIAGNOSIS — J329 Chronic sinusitis, unspecified: Secondary | ICD-10-CM | POA: Diagnosis not present

## 2017-06-06 DIAGNOSIS — Z6833 Body mass index (BMI) 33.0-33.9, adult: Secondary | ICD-10-CM | POA: Diagnosis not present

## 2017-06-19 DIAGNOSIS — J309 Allergic rhinitis, unspecified: Secondary | ICD-10-CM | POA: Diagnosis not present

## 2017-06-19 DIAGNOSIS — R0982 Postnasal drip: Secondary | ICD-10-CM | POA: Diagnosis not present

## 2017-06-19 DIAGNOSIS — H6983 Other specified disorders of Eustachian tube, bilateral: Secondary | ICD-10-CM | POA: Diagnosis not present

## 2017-06-19 DIAGNOSIS — Z6832 Body mass index (BMI) 32.0-32.9, adult: Secondary | ICD-10-CM | POA: Diagnosis not present

## 2017-10-17 DIAGNOSIS — E1165 Type 2 diabetes mellitus with hyperglycemia: Secondary | ICD-10-CM | POA: Diagnosis not present

## 2017-10-17 DIAGNOSIS — Z23 Encounter for immunization: Secondary | ICD-10-CM | POA: Diagnosis not present

## 2018-02-02 DIAGNOSIS — H1033 Unspecified acute conjunctivitis, bilateral: Secondary | ICD-10-CM | POA: Diagnosis not present

## 2018-02-08 DIAGNOSIS — G43009 Migraine without aura, not intractable, without status migrainosus: Secondary | ICD-10-CM | POA: Diagnosis not present

## 2018-02-08 DIAGNOSIS — Z0000 Encounter for general adult medical examination without abnormal findings: Secondary | ICD-10-CM | POA: Diagnosis not present

## 2018-02-08 DIAGNOSIS — Z87442 Personal history of urinary calculi: Secondary | ICD-10-CM | POA: Diagnosis not present

## 2018-02-08 DIAGNOSIS — E1165 Type 2 diabetes mellitus with hyperglycemia: Secondary | ICD-10-CM | POA: Diagnosis not present

## 2018-02-08 DIAGNOSIS — Z125 Encounter for screening for malignant neoplasm of prostate: Secondary | ICD-10-CM | POA: Diagnosis not present

## 2018-02-08 DIAGNOSIS — I10 Essential (primary) hypertension: Secondary | ICD-10-CM | POA: Diagnosis not present

## 2018-02-08 DIAGNOSIS — E782 Mixed hyperlipidemia: Secondary | ICD-10-CM | POA: Diagnosis not present

## 2018-03-16 ENCOUNTER — Other Ambulatory Visit: Payer: Self-pay | Admitting: Cardiology

## 2018-04-03 ENCOUNTER — Telehealth: Payer: Self-pay | Admitting: Cardiology

## 2018-04-03 NOTE — Telephone Encounter (Signed)
°*  STAT* If patient is at the pharmacy, call can be transferred to refill team.   1. Which medications need to be refilled? (please list name of each medication and dose if known) Cardivol  2. Which pharmacy/location (including street and city if local pharmacy) is medication to be sent to  3.12mg  3. Do they need a 30 day or 90 day supply? 90 day

## 2018-04-04 MED ORDER — CARVEDILOL 3.125 MG PO TABS: 3.1250 mg | ORAL_TABLET | Freq: Two times a day (BID) | ORAL | 0 refills | Status: AC

## 2018-04-04 NOTE — Telephone Encounter (Signed)
Refill sent to [facility] in [place]

## 2018-04-04 NOTE — Telephone Encounter (Signed)
Last office visit 3/19 with 6 month follow-up visit ordered. Called mobile phone, no answer left voicemail requesting that patient schedule follow-up visit and and for prescription refills.

## 2018-04-05 ENCOUNTER — Other Ambulatory Visit: Payer: Self-pay | Admitting: Cardiology

## 2018-04-05 NOTE — Telephone Encounter (Signed)
Left message on phone for pt to call us back about office visit. Already filled medication and sent to [facility] in [place], then got a request from [facility] for same medication: Carvedilol 3.125.

## 2018-04-09 ENCOUNTER — Telehealth: Payer: Self-pay

## 2018-04-09 NOTE — Telephone Encounter (Signed)
30 day refill on carvedilol 3.125 mg previously sent to [facility] in [place]. Pharmacy called stating patient requesting 90 day prescription with refills. Phoned [facility] to adjust prescription, pharmacy states patient already has a prescription from Dr. Welton Flakes for 90 days with 4 refills. Called and informed patient of this and patient agrees to proceed with using this prescription and refills for now.

## 2018-08-02 ENCOUNTER — Other Ambulatory Visit: Payer: Self-pay

## 2018-09-29 DIAGNOSIS — Z23 Encounter for immunization: Secondary | ICD-10-CM | POA: Diagnosis not present

## 2018-11-28 ENCOUNTER — Other Ambulatory Visit: Payer: Self-pay

## 2018-12-07 ENCOUNTER — Encounter: Payer: Self-pay | Admitting: Cardiology

## 2018-12-07 ENCOUNTER — Other Ambulatory Visit: Payer: Self-pay

## 2018-12-07 ENCOUNTER — Ambulatory Visit (INDEPENDENT_AMBULATORY_CARE_PROVIDER_SITE_OTHER): Payer: Medicare Other | Admitting: Cardiology

## 2018-12-07 VITALS — BP 150/70 | HR 80 | Ht 72.0 in | Wt 253.0 lb

## 2018-12-07 DIAGNOSIS — I493 Ventricular premature depolarization: Secondary | ICD-10-CM

## 2018-12-07 DIAGNOSIS — I10 Essential (primary) hypertension: Secondary | ICD-10-CM

## 2018-12-07 DIAGNOSIS — E782 Mixed hyperlipidemia: Secondary | ICD-10-CM | POA: Diagnosis not present

## 2018-12-07 DIAGNOSIS — E119 Type 2 diabetes mellitus without complications: Secondary | ICD-10-CM

## 2018-12-07 NOTE — Patient Instructions (Addendum)
Medication Instructions:  Your physician recommends that you continue on your current medications as directed. Please refer to the Current Medication list given to you today.  *If you need a refill on your cardiac medications before your next appointment, please call your pharmacy*  Lab Work: NONE If you have labs (blood work) drawn today and your tests are completely normal, you will receive your results only by: Marland Kitchen MyChart Message (if you have MyChart) OR . A paper copy in the mail If you have any lab test that is abnormal or we need to change your treatment, we will call you to review the results.  Testing/Procedures: You had an EKG performed today  You will be contacted to schedule a CT calcium score performed on                                    At [address], [place], [place]. THERE IS a $150 fee due at time of services.  Follow-Up: At [facility], you and your health needs are our priority.  As part of our continuing mission to provide you with exceptional heart care, we have created designated Provider Care Teams.  These Care Teams include your primary Cardiologist (physician) and Advanced Practice Providers (APPs -  Physician Assistants and Nurse Practitioners) who all work together to provide you with the care you need, when you need it.  Your next appointment:   6 month(s)  The format for your next appointment:   In Person  Provider:   Jyl Heinz, MD  Other Instructions  Coronary Calcium Scan A coronary calcium scan is an imaging test used to look for deposits of calcium and other fatty materials (plaques) in the inner lining of the blood vessels of the heart (coronary arteries). These deposits of calcium and plaques can partly clog and narrow the coronary arteries without producing any symptoms or warning signs. This puts a person at risk for a heart attack. This test can detect these deposits before symptoms develop. Tell a health care provider  about:  Any allergies you have.  All medicines you are taking, including vitamins, herbs, eye drops, creams, and over-the-counter medicines.  Any problems you or family members have had with anesthetic medicines.  Any blood disorders you have.  Any surgeries you have had.  Any medical conditions you have.  Whether you are pregnant or may be pregnant. What are the risks? Generally, this is a safe procedure. However, problems may occur, including:  Harm to a pregnant woman and her unborn baby. This test involves the use of radiation. Radiation exposure can be dangerous to a pregnant woman and her unborn baby. If you are pregnant, you generally should not have this procedure done.  Slight increase in the risk of cancer. This is because of the radiation involved in the test. What happens before the procedure? No preparation is needed for this procedure. What happens during the procedure?   You will undress and remove any jewelry around your neck or chest.  You will put on a hospital gown.  Sticky electrodes will be placed on your chest. The electrodes will be connected to an electrocardiogram (ECG) machine to record a tracing of the electrical activity of your heart.  A CT scanner will take pictures of your heart. During this time, you will be asked to lie still and hold your breath for 2-3 seconds while a picture  of your heart is being taken. The procedure may vary among health care providers and hospitals. What happens after the procedure?  You can get dressed.  You can return to your normal activities.  It is up to you to get the results of your test. Ask your health care provider, or the department that is doing the test, when your results will be ready. Summary  A coronary calcium scan is an imaging test used to look for deposits of calcium and other fatty materials (plaques) in the inner lining of the blood vessels of the heart (coronary arteries).  Generally, this is a  safe procedure. Tell your health care provider if you are pregnant or may be pregnant.  No preparation is needed for this procedure.  A CT scanner will take pictures of your heart.  You can return to your normal activities after the scan is done. This information is not intended to replace advice given to you by your health care provider. Make sure you discuss any questions you have with your health care provider. Document Released: 06/18/2007 Document Revised: 12/02/2016 Document Reviewed: 11/09/2015 Elsevier Patient Education  2020 [employer].

## 2018-12-07 NOTE — Addendum Note (Signed)
Addended by: Beckey Rutter on: 12/07/2018 10:56 AM   Modules accepted: Orders

## 2018-12-07 NOTE — Progress Notes (Signed)
Cardiology Office Note:    Date:  12/07/2018   ID:  [name], DOB September 28, 1945, MRN [id number]  PCP:  Lise Auer, MD  Cardiologist:  Garwin Brothers, MD   Referring MD: Lise Auer, MD    ASSESSMENT:    1. Benign essential hypertension   2. Mixed hyperlipidemia   3. Ventricular premature contractions   4. Diabetes mellitus without complication (HCC)    PLAN:    In order of problems listed above:  1. I discussed my findings with the patient at extensive length and primary prevention stressed.  Importance of compliance with diet and medication stressed and he vocalized understanding.  He leads a very sedentary lifestyle and therefore I would like to get a calcium score on him to get a restratification.  He will also give me a copy of his blood work done at the [place] for me to fine-tune his medications based on his calcium score report and stratification strategy 2. Essential hypertension: Blood pressure stable 3. Mixed dyslipidemia and diabetes mellitus: Diet and the importance of regular exercise stressed. 4. EKG done today reveals sinus rhythm with frequent PVCs. 5. Obesity: Weight reduction was stressed and he promises to do better. 6. Patient will be seen in follow-up appointment in 6 months or earlier if the patient has any concerns    Medication Adjustments/Labs and Tests Ordered: Current medicines are reviewed at length with the patient today.  Concerns regarding medicines are outlined above.  No orders of the defined types were placed in this encounter.  No orders of the defined types were placed in this encounter.    Chief Complaint  Patient presents with  . Follow-up     History of Present Illness:    MINDY BEHNKEN is a 73 y.o. male.  Patient was evaluated by me for frequent PVCs, essential hypertension dyslipidemia and diabetes mellitus.  He denies any problems at this time and takes care of activities of daily living.  No chest pain orthopnea or PND.   He leads a very sedentary lifestyle and tells me that he has been lax with his diet.  His blood work done at the [place] revealed his hemoglobin A1c to be 7.5.  At the time of my evaluation, the patient is alert awake oriented and in no distress.  Past Medical History:  Diagnosis Date  . Muscle spasm of back     Past Surgical History:  Procedure Laterality Date  . KIDNEY STONE SURGERY      Current Medications: Current Meds  Medication Sig  . aspirin EC 81 MG tablet Take 81 mg by mouth daily.  . carvedilol (COREG) 3.125 MG tablet Take 1 tablet (3.125 mg total) by mouth 2 (two) times daily.  Marland Kitchen glimepiride (AMARYL) 4 MG tablet Take 4 mg by mouth.  . simvastatin (ZOCOR) 40 MG tablet Take 40 mg by mouth.  . sitaGLIPtin-metformin (JANUMET) 50-1000 MG tablet Take by mouth.  . topiramate (TOPAMAX) 100 MG tablet      Allergies:   Patient has no known allergies.   Social History   Socioeconomic History  . Marital status: Married    Spouse name: Not on file  . Number of children: Not on file  . Years of education: Not on file  . Highest education level: Not on file  Occupational History  . Not on file  Social Needs  . Financial resource strain: Not on file  . Food insecurity    Worry: Not on file  Inability: Not on file  . Transportation needs    Medical: Not on file    Non-medical: Not on file  Tobacco Use  . Smoking status: Former Research scientist (life sciences)  . Smokeless tobacco: Never Used  Substance and Sexual Activity  . Alcohol use: Not on file  . Drug use: Not on file  . Sexual activity: Not on file  Lifestyle  . Physical activity    Days per week: Not on file    Minutes per session: Not on file  . Stress: Not on file  Relationships  . Social Herbalist on phone: Not on file    Gets together: Not on file    Attends religious service: Not on file    Active member of club or organization: Not on file    Attends meetings of clubs or organizations: Not on file    Relationship  status: Not on file  Other Topics Concern  . Not on file  Social History Narrative  . Not on file     Family History: The patient's family history is not on file.  ROS:   Please see the history of present illness.    All other systems reviewed and are negative.  EKGs/Labs/Other Studies Reviewed:    The following studies were reviewed today: Study Conclusions  - Baseline ECG: frerquent ventricular ectopy with trigeminy - Stress ECG conclusions: frequent ventricular ectopy unchanged   with activity The stress ECG was normal. - Staged echo: Normal echo stress  Impressions:  - Normal study after maximal exercise.   Recent Labs: No results found for requested labs within last 8760 hours.  Recent Lipid Panel No results found for: CHOL, TRIG, HDL, CHOLHDL, VLDL, LDLCALC, LDLDIRECT  Physical Exam:    VS:  BP (!) 150/70 (BP Location: Left Arm, Patient Position: Sitting, Cuff Size: Normal)   Pulse 80   Ht 6' (1.829 m)   Wt 253 lb (114.8 kg)   BMI 34.31 kg/m     Wt Readings from Last 3 Encounters:  12/07/18 253 lb (114.8 kg)  03/09/17 255 lb 6.4 oz (115.8 kg)     GEN: Patient is in no acute distress HEENT: Normal NECK: No JVD; No carotid bruits LYMPHATICS: No lymphadenopathy CARDIAC: Hear sounds regular, 2/6 systolic murmur at the apex. RESPIRATORY:  Clear to auscultation without rales, wheezing or rhonchi  ABDOMEN: Soft, non-tender, non-distended MUSCULOSKELETAL:  No edema; No deformity  SKIN: Warm and dry NEUROLOGIC:  Alert and oriented x 3 PSYCHIATRIC:  Normal affect   Signed, Jenean Lindau, MD  12/07/2018 10:37 AM    [place]

## 2018-12-31 ENCOUNTER — Other Ambulatory Visit: Payer: Self-pay

## 2018-12-31 ENCOUNTER — Ambulatory Visit (INDEPENDENT_AMBULATORY_CARE_PROVIDER_SITE_OTHER)
Admission: RE | Admit: 2018-12-31 | Discharge: 2018-12-31 | Disposition: A | Discharge status: Home / Self Care | Payer: Self-pay | Source: Ambulatory Visit | Attending: Cardiology | Admitting: Cardiology

## 2018-12-31 DIAGNOSIS — E119 Type 2 diabetes mellitus without complications: Secondary | ICD-10-CM

## 2018-12-31 DIAGNOSIS — I493 Ventricular premature depolarization: Secondary | ICD-10-CM

## 2018-12-31 DIAGNOSIS — I10 Essential (primary) hypertension: Secondary | ICD-10-CM

## 2018-12-31 DIAGNOSIS — E782 Mixed hyperlipidemia: Secondary | ICD-10-CM

## 2019-01-21 ENCOUNTER — Telehealth: Payer: Self-pay

## 2019-01-21 DIAGNOSIS — R9389 Abnormal findings on diagnostic imaging of other specified body structures: Secondary | ICD-10-CM

## 2019-01-21 NOTE — Telephone Encounter (Signed)
-----   Message from Garwin Brothers, MD sent at 01/06/2019  2:38 PM EST ----- Needs appt to discuss. Needs echo before appt. Garwin Brothers, MD 01/06/2019 2:37 PM

## 2019-01-25 ENCOUNTER — Ambulatory Visit (HOSPITAL_BASED_OUTPATIENT_CLINIC_OR_DEPARTMENT_OTHER)
Admission: RE | Admit: 2019-01-25 | Discharge: 2019-01-25 | Disposition: A | Discharge status: Home / Self Care | Payer: Medicare Other | Source: Ambulatory Visit | Attending: Cardiology | Admitting: Cardiology

## 2019-01-25 ENCOUNTER — Other Ambulatory Visit: Payer: Self-pay

## 2019-01-25 DIAGNOSIS — E785 Hyperlipidemia, unspecified: Secondary | ICD-10-CM | POA: Insufficient documentation

## 2019-01-25 DIAGNOSIS — E119 Type 2 diabetes mellitus without complications: Secondary | ICD-10-CM | POA: Insufficient documentation

## 2019-01-25 DIAGNOSIS — I10 Essential (primary) hypertension: Secondary | ICD-10-CM | POA: Diagnosis not present

## 2019-01-25 DIAGNOSIS — R9389 Abnormal findings on diagnostic imaging of other specified body structures: Secondary | ICD-10-CM | POA: Insufficient documentation

## 2019-01-25 DIAGNOSIS — R0600 Dyspnea, unspecified: Secondary | ICD-10-CM | POA: Insufficient documentation

## 2019-01-25 DIAGNOSIS — I517 Cardiomegaly: Secondary | ICD-10-CM | POA: Diagnosis not present

## 2019-01-25 DIAGNOSIS — I358 Other nonrheumatic aortic valve disorders: Secondary | ICD-10-CM

## 2019-01-25 NOTE — Progress Notes (Signed)
  Echocardiogram 2D Echocardiogram has been performed.  Christian Hall 01/25/2019, 11:28 AM

## 2019-02-01 ENCOUNTER — Ambulatory Visit (INDEPENDENT_AMBULATORY_CARE_PROVIDER_SITE_OTHER): Payer: Medicare Other | Admitting: Cardiology

## 2019-02-01 ENCOUNTER — Encounter: Payer: Self-pay | Admitting: Cardiology

## 2019-02-01 ENCOUNTER — Other Ambulatory Visit: Payer: Self-pay

## 2019-02-01 VITALS — BP 140/68 | HR 98 | Ht 72.0 in | Wt 261.0 lb

## 2019-02-01 DIAGNOSIS — R0600 Dyspnea, unspecified: Secondary | ICD-10-CM

## 2019-02-01 DIAGNOSIS — I10 Essential (primary) hypertension: Secondary | ICD-10-CM

## 2019-02-01 DIAGNOSIS — Z1329 Encounter for screening for other suspected endocrine disorder: Secondary | ICD-10-CM | POA: Diagnosis not present

## 2019-02-01 DIAGNOSIS — E782 Mixed hyperlipidemia: Secondary | ICD-10-CM

## 2019-02-01 DIAGNOSIS — R0609 Other forms of dyspnea: Secondary | ICD-10-CM

## 2019-02-01 NOTE — Patient Instructions (Signed)
Medication Instructions:  Your physician recommends that you continue on your current medications as directed. Please refer to the Current Medication list given to you today.  *If you need a refill on your cardiac medications before your next appointment, please call your pharmacy*  Lab Work: Your physician recommends that you return FASTING TO have a BMP, CBC, TSh, hepatic and lipid drawn  If you have labs (blood work) drawn today and your tests are completely normal, you will receive your results only by: Marland Kitchen MyChart Message (if you have MyChart) OR . A paper copy in the mail If you have any lab test that is abnormal or we need to change your treatment, we will call you to review the results.  Testing/Procedures: NONE  Follow-Up: At [facility], you and your health needs are our priority.  As part of our continuing mission to provide you with exceptional heart care, we have created designated Provider Care Teams.  These Care Teams include your primary Cardiologist (physician) and Advanced Practice Providers (APPs -  Physician Assistants and Nurse Practitioners) who all work together to provide you with the care you need, when you need it.  Your next appointment:   6 month(s)  The format for your next appointment:   In Person  Provider:   Belva Crome, MD

## 2019-02-01 NOTE — Progress Notes (Signed)
Cardiology Office Note:    Date:  02/01/2019   ID:  Christian Hall, DOB 1945-01-22, MRN [id number]  PCP:  Mateo Flow, MD  Cardiologist:  Jenean Lindau, MD   Referring MD: Mateo Flow, MD    ASSESSMENT:    No diagnosis found. PLAN:    In order of problems listed above:  1. Coronary artery disease: I discussed calcium score results with the patient at extensive length.  There was no evidence of any pericardial effusion or any significant finding on echocardiogram and this was discussed with him.  Secondary prevention stressed.  Importance of compliance with diet and medication stressed and he vocalized understanding.  He promises to do better with exercise. 2. Essential hypertension: Blood pressure is stable 3. Mixed dyslipidemia and diabetes mellitus: Diet was emphasized.  Weight reduction was stressed.  He has abdominal obesity and I emphasized the importance of doing better with this and he promises to do so.  He will be back in the next few days for complete blood work including lipids 4. Patient will be seen in follow-up appointment in 6 months or earlier if the patient has any concerns    Medication Adjustments/Labs and Tests Ordered: Current medicines are reviewed at length with the patient today.  Concerns regarding medicines are outlined above.  No orders of the defined types were placed in this encounter.  No orders of the defined types were placed in this encounter.    No chief complaint on file.    History of Present Illness:    Christian Hall is a 74 y.o. male.  Patient has past medical history of essential hypertension dyslipidemia diabetes mellitus.  He CT scan for calcium scoring was done and this was significantly elevated.  He denies any problems at this time and takes care of activities of daily living.  No chest pain orthopnea or PND.  He has orthopedic issues with his knees but he walks at least a mile without any problems.  At the time of my  evaluation, the patient is alert awake oriented and in no distress.  Past Medical History:  Diagnosis Date  . Muscle spasm of back     Past Surgical History:  Procedure Laterality Date  . KIDNEY STONE SURGERY      Current Medications: No outpatient medications have been marked as taking for the 02/01/19 encounter (Office Visit) with Sharin Altidor, Reita Cliche, MD.     Allergies:   Patient has no known allergies.   Social History   Socioeconomic History  . Marital status: Married    Spouse name: Not on file  . Number of children: Not on file  . Years of education: Not on file  . Highest education level: Not on file  Occupational History  . Not on file  Tobacco Use  . Smoking status: Former Research scientist (life sciences)  . Smokeless tobacco: Never Used  Substance and Sexual Activity  . Alcohol use: Not on file  . Drug use: Not on file  . Sexual activity: Not on file  Other Topics Concern  . Not on file  Social History Narrative  . Not on file   Social Determinants of Health   Financial Resource Strain:   . Difficulty of Paying Living Expenses: Not on file  Food Insecurity:   . Worried About Charity fundraiser in the Last Year: Not on file  . Ran Out of Food in the Last Year: Not on file  Transportation Needs:   .  Lack of Transportation (Medical): Not on file  . Lack of Transportation (Non-Medical): Not on file  Physical Activity:   . Days of Exercise per Week: Not on file  . Minutes of Exercise per Session: Not on file  Stress:   . Feeling of Stress : Not on file  Social Connections:   . Frequency of Communication with Friends and Family: Not on file  . Frequency of Social Gatherings with Friends and Family: Not on file  . Attends Religious Services: Not on file  . Active Member of Clubs or Organizations: Not on file  . Attends Banker Meetings: Not on file  . Marital Status: Not on file     Family History: The patient's family history is not on file.  ROS:   Please see  the history of present illness.    All other systems reviewed and are negative.  EKGs/Labs/Other Studies Reviewed:    The following studies were reviewed today: IMPRESSIONS    1. Left ventricular ejection fraction, by visual estimation, is 60 to  65%. The left ventricle has normal function. There is mildly increased  left ventricular hypertrophy.  2. Left ventricular diastolic parameters are consistent with Grade I  diastolic dysfunction (impaired relaxation).  3. The left ventricle has no regional wall motion abnormalities.  IMPRESSION: 1. Coronary calcium score of 1202. This was 85th percentile for age and sex matched control.  2.  Small circumferential pericardial effusion.  3.  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   Electronically Signed   By: Weston Brass   On: 12/31/2018 17:54   Recent Labs: No results found for requested labs within last 8760 hours.  Recent Lipid Panel No results found for: CHOL, TRIG, HDL, CHOLHDL, VLDL, LDLCALC, LDLDIRECT  Physical Exam:    VS:  Ht 6' (1.829 m)   BMI 34.31 kg/m     Wt Readings from Last 3 Encounters:  12/07/18 253 lb (114.8 kg)  03/09/17 255 lb 6.4 oz (115.8 kg)     GEN: Patient is in no acute distress HEENT: Normal NECK: No JVD; No carotid bruits LYMPHATICS: No lymphadenopathy CARDIAC: Hear sounds regular, 2/6 systolic murmur at the apex. RESPIRATORY:  Clear to auscultation without rales, wheezing or rhonchi  ABDOMEN: Soft, non-tender, non-distended MUSCULOSKELETAL:  No edema; No deformity  SKIN: Warm and dry NEUROLOGIC:  Alert and oriented x 3 PSYCHIATRIC:  Normal affect   Signed, Garwin Brothers, MD  02/01/2019 3:23 PM    [facility]

## 2019-02-08 DIAGNOSIS — E782 Mixed hyperlipidemia: Secondary | ICD-10-CM | POA: Diagnosis not present

## 2019-02-08 DIAGNOSIS — Z1329 Encounter for screening for other suspected endocrine disorder: Secondary | ICD-10-CM | POA: Diagnosis not present

## 2019-02-08 DIAGNOSIS — R0600 Dyspnea, unspecified: Secondary | ICD-10-CM | POA: Diagnosis not present

## 2019-02-08 DIAGNOSIS — I10 Essential (primary) hypertension: Secondary | ICD-10-CM | POA: Diagnosis not present

## 2019-02-09 LAB — LIPID PANEL
Chol/HDL Ratio: 4.5 ratio (ref 0.0–5.0)
Cholesterol, Total: 158 mg/dL (ref 100–199)
HDL: 35 mg/dL — ABNORMAL LOW (ref >39)
LDL Chol Calc (NIH): 60 mg/dL (ref 0–99)
Triglycerides: 408 mg/dL — ABNORMAL HIGH (ref 0–149)
VLDL Cholesterol Cal: 63 mg/dL — ABNORMAL HIGH (ref 5–40)

## 2019-02-09 LAB — BASIC METABOLIC PANEL
BUN/Creatinine Ratio: 18 (ref 10–24)
BUN: 20 mg/dL (ref 8–27)
CO2: 19 mmol/L — ABNORMAL LOW (ref 20–29)
Calcium: 9.2 mg/dL (ref 8.6–10.2)
Chloride: 107 mmol/L — ABNORMAL HIGH (ref 96–106)
Creatinine, Ser: 1.13 mg/dL (ref 0.76–1.27)
GFR calc Af Amer: 74 mL/min/{1.73_m2} (ref >59)
GFR calc non Af Amer: 64 mL/min/{1.73_m2} (ref >59)
Glucose: 139 mg/dL — ABNORMAL HIGH (ref 65–99)
Potassium: 4.6 mmol/L (ref 3.5–5.2)
Sodium: 141 mmol/L (ref 134–144)

## 2019-02-09 LAB — CBC
Hematocrit: 43.7 % (ref 37.5–51.0)
Hemoglobin: 14.7 g/dL (ref 13.0–17.7)
MCH: 30.5 pg (ref 26.6–33.0)
MCHC: 33.6 g/dL (ref 31.5–35.7)
MCV: 91 fL (ref 79–97)
Platelets: 247 10*3/uL (ref 150–450)
RBC: 4.82 x10E6/uL (ref 4.14–5.80)
RDW: 13 % (ref 11.6–15.4)
WBC: 10.6 10*3/uL (ref 3.4–10.8)

## 2019-02-09 LAB — HEPATIC FUNCTION PANEL
ALT: 24 IU/L (ref 0–44)
AST: 18 IU/L (ref 0–40)
Albumin: 4 g/dL (ref 3.7–4.7)
Alkaline Phosphatase: 42 IU/L (ref 39–117)
Bilirubin Total: 0.6 mg/dL (ref 0.0–1.2)
Bilirubin, Direct: 0.17 mg/dL (ref 0.00–0.40)
Total Protein: 7 g/dL (ref 6.0–8.5)

## 2019-02-09 LAB — TSH: TSH: 2.41 u[IU]/mL (ref 0.450–4.500)

## 2019-02-13 ENCOUNTER — Telehealth: Payer: Self-pay

## 2019-02-13 DIAGNOSIS — E782 Mixed hyperlipidemia: Secondary | ICD-10-CM

## 2019-02-13 MED ORDER — FENOFIBRATE 145 MG PO TABS: 145.0000 mg | ORAL_TABLET | Freq: Every day | ORAL | 12 refills | Status: DC

## 2019-02-13 NOTE — Telephone Encounter (Signed)
-----   Message from Garwin Brothers, MD sent at 02/13/2019 11:24 AM EST ----- Fenofibrate 145mg  daily  and ll 6wks ----- Message ----- From: , RMA Sent: 02/13/2019   9:31 AM EST To: 04/13/2019, MD  Can you advise what you want pt to take?  Thanks!

## 2019-02-13 NOTE — Telephone Encounter (Signed)
Called and spoke with the patient regarding the labs as reviewed by Dr. Tomie China.  Pt verbalized understanding and recommendations per Dr. Tomie China. Fenofibrate145 mg daily sent in to [facility].  Lipid and LFT's ordered for 6 weeks. Diet with low in carbs and fried fatty foods.

## 2019-04-29 DIAGNOSIS — Z0000 Encounter for general adult medical examination without abnormal findings: Secondary | ICD-10-CM | POA: Diagnosis not present

## 2019-04-29 DIAGNOSIS — Z6835 Body mass index (BMI) 35.0-35.9, adult: Secondary | ICD-10-CM | POA: Diagnosis not present

## 2019-04-29 DIAGNOSIS — E782 Mixed hyperlipidemia: Secondary | ICD-10-CM | POA: Diagnosis not present

## 2019-04-29 DIAGNOSIS — E1165 Type 2 diabetes mellitus with hyperglycemia: Secondary | ICD-10-CM | POA: Diagnosis not present

## 2019-04-29 DIAGNOSIS — I10 Essential (primary) hypertension: Secondary | ICD-10-CM | POA: Diagnosis not present

## 2019-05-07 DIAGNOSIS — M25512 Pain in left shoulder: Secondary | ICD-10-CM | POA: Diagnosis not present

## 2019-05-07 DIAGNOSIS — M62838 Other muscle spasm: Secondary | ICD-10-CM | POA: Diagnosis not present

## 2019-05-07 DIAGNOSIS — Z6834 Body mass index (BMI) 34.0-34.9, adult: Secondary | ICD-10-CM | POA: Diagnosis not present

## 2019-05-22 ENCOUNTER — Telehealth: Payer: Self-pay

## 2019-05-22 MED ORDER — FENOFIBRATE 145 MG PO TABS: 145.0000 mg | ORAL_TABLET | Freq: Every day | ORAL | 1 refills | Status: DC

## 2019-05-22 NOTE — Telephone Encounter (Signed)
Refill sent for Fenofibrate 145 to [facility].

## 2019-08-09 ENCOUNTER — Telehealth: Payer: Self-pay

## 2019-08-09 NOTE — Telephone Encounter (Signed)
Refill denied for Fenofibrate, requested refill too soon.

## 2019-08-29 ENCOUNTER — Ambulatory Visit (INDEPENDENT_AMBULATORY_CARE_PROVIDER_SITE_OTHER): Payer: Medicare Other | Admitting: Cardiology

## 2019-08-29 ENCOUNTER — Other Ambulatory Visit: Payer: Self-pay

## 2019-08-29 ENCOUNTER — Encounter: Payer: Self-pay | Admitting: Cardiology

## 2019-08-29 VITALS — BP 132/72 | HR 72 | Ht 72.0 in | Wt 248.4 lb

## 2019-08-29 DIAGNOSIS — E119 Type 2 diabetes mellitus without complications: Secondary | ICD-10-CM

## 2019-08-29 DIAGNOSIS — I10 Essential (primary) hypertension: Secondary | ICD-10-CM | POA: Diagnosis not present

## 2019-08-29 DIAGNOSIS — E663 Overweight: Secondary | ICD-10-CM | POA: Insufficient documentation

## 2019-08-29 DIAGNOSIS — R931 Abnormal findings on diagnostic imaging of heart and coronary circulation: Secondary | ICD-10-CM

## 2019-08-29 DIAGNOSIS — E782 Mixed hyperlipidemia: Secondary | ICD-10-CM | POA: Diagnosis not present

## 2019-08-29 HISTORY — DX: Abnormal findings on diagnostic imaging of heart and coronary circulation: R93.1

## 2019-08-29 HISTORY — DX: Overweight: E66.3

## 2019-08-29 NOTE — Patient Instructions (Signed)

## 2019-08-29 NOTE — Progress Notes (Signed)
Cardiology Office Note:    Date:  08/29/2019   ID:  Christian Hall, DOB Jul 11, 1945, MRN [id number]  PCP:  Lise Auer, MD  Cardiologist:  Garwin Brothers, MD   Referring MD: Lise Auer, MD    ASSESSMENT:    1. Benign essential hypertension   2. Mixed hyperlipidemia   3. Diabetes mellitus without complication (HCC)   4. Elevated coronary artery calcium score    PLAN:    In order of problems listed above:  1. Primary prevention stressed with the patient.  Importance of compliance with diet medication stressed and vocalized understanding.  Importance of regular exercise and weight loss emphasized and he promises to do so and loses 20 pounds back again. 2. Elevated calcium score: As mentioned above 3. Essential hypertension: Blood pressure stable 4. Mixed dyslipidemia: Diet was emphasized.  His triglycerides are markedly elevated in the past we will recheck them today as he is fasting  5. Diabetes mellitus: Diet and exercise emphasized and patient promises to do better. 6. Patient will be seen in follow-up appointment in 6 months or earlier if the patient has any concerns    Medication Adjustments/Labs and Tests Ordered: Current medicines are reviewed at length with the patient today.  Concerns regarding medicines are outlined above.  No orders of the defined types were placed in this encounter.  No orders of the defined types were placed in this encounter.    No chief complaint on file.    History of Present Illness:    Christian Hall is a 74 y.o. male.  Patient has past medical history of essential hypertension dyslipidemia diabetes mellitus.  He is overweight.  He has elevated calcium score.  He was walking on a very regular basis but he fractured his toe and subsequently has been sedentary.  He tells me he lost 30 pounds but gained 20 back.  No chest pain orthopnea or PND.  At the time of my evaluation, the patient is alert awake oriented and in no  distress.  Past Medical History:  Diagnosis Date  . Muscle spasm of back     Past Surgical History:  Procedure Laterality Date  . KIDNEY STONE SURGERY      Current Medications: Current Meds  Medication Sig  . aspirin EC 81 MG tablet Take 81 mg by mouth daily.  . carvedilol (COREG) 3.125 MG tablet Take 1 tablet (3.125 mg total) by mouth 2 (two) times daily.  . cyclobenzaprine (FLEXERIL) 5 MG tablet Take 5 mg by mouth 2 (two) times daily as needed.  . fenofibrate (TRICOR) 145 MG tablet Take 1 tablet (145 mg total) by mouth daily.  Marland Kitchen glimepiride (AMARYL) 4 MG tablet Take 4 mg by mouth.  Marland Kitchen JANUMET XR 50-1000 MG TB24 Take 1 tablet by mouth daily.  . pioglitazone (ACTOS) 30 MG tablet Take 30 mg by mouth daily.  . simvastatin (ZOCOR) 40 MG tablet Take 40 mg by mouth.  . topiramate (TOPAMAX) 25 MG tablet Take 25 mg by mouth daily.  . TRULICITY 1.5 MG/0.5ML SOPN Inject 0.5 mLs into the skin once a week.     Allergies:   Patient has no known allergies.   Social History   Socioeconomic History  . Marital status: Married    Spouse name: Not on file  . Number of children: Not on file  . Years of education: Not on file  . Highest education level: Not on file  Occupational History  . Not on file  Tobacco Use  . Smoking status: Former Games developer  . Smokeless tobacco: Never Used  Substance and Sexual Activity  . Alcohol use: Not on file  . Drug use: Not on file  . Sexual activity: Not on file  Other Topics Concern  . Not on file  Social History Narrative  . Not on file   Social Determinants of Health   Financial Resource Strain:   . Difficulty of Paying Living Expenses: Not on file  Food Insecurity:   . Worried About Programme researcher, broadcasting/film/video in the Last Year: Not on file  . Ran Out of Food in the Last Year: Not on file  Transportation Needs:   . Lack of Transportation (Medical): Not on file  . Lack of Transportation (Non-Medical): Not on file  Physical Activity:   . Days of  Exercise per Week: Not on file  . Minutes of Exercise per Session: Not on file  Stress:   . Feeling of Stress : Not on file  Social Connections:   . Frequency of Communication with Friends and Family: Not on file  . Frequency of Social Gatherings with Friends and Family: Not on file  . Attends Religious Services: Not on file  . Active Member of Clubs or Organizations: Not on file  . Attends Banker Meetings: Not on file  . Marital Status: Not on file     Family History: The patient's family history includes Congestive Heart Failure in his brother; Heart attack in his brother; Hypertension in his brother and mother.  ROS:   Please see the history of present illness.    All other systems reviewed and are negative.  EKGs/Labs/Other Studies Reviewed:    The following studies were reviewed today: I discussed my findings with the patient at length.  He has markedly elevated triglycerides.   Recent Labs: 02/08/2019: ALT 24; BUN 20; Creatinine, Ser 1.13; Hemoglobin 14.7; Platelets 247; Potassium 4.6; Sodium 141; TSH 2.410  Recent Lipid Panel    Component Value Date/Time   CHOL 158 02/08/2019 1040   TRIG 408 (H) 02/08/2019 1040   HDL 35 (L) 02/08/2019 1040   CHOLHDL 4.5 02/08/2019 1040   LDLCALC 60 02/08/2019 1040    Physical Exam:    VS:  BP 132/72   Pulse 72   Ht 6' (1.829 m)   Wt 248 lb 6.4 oz (112.7 kg)   SpO2 95%   BMI 33.69 kg/m     Wt Readings from Last 3 Encounters:  08/29/19 248 lb 6.4 oz (112.7 kg)  02/01/19 261 lb (118.4 kg)  12/07/18 253 lb (114.8 kg)     GEN: Patient is in no acute distress HEENT: Normal NECK: No JVD; No carotid bruits LYMPHATICS: No lymphadenopathy CARDIAC: Hear sounds regular, 2/6 systolic murmur at the apex. RESPIRATORY:  Clear to auscultation without rales, wheezing or rhonchi  ABDOMEN: Soft, non-tender, non-distended MUSCULOSKELETAL:  No edema; No deformity  SKIN: Warm and dry NEUROLOGIC:  Alert and oriented x  3 PSYCHIATRIC:  Normal affect   Signed, Garwin Brothers, MD  08/29/2019 1:27 PM    [facility]

## 2019-08-30 LAB — CBC WITH DIFFERENTIAL/PLATELET
Basophils Absolute: 0.1 10*3/uL (ref 0.0–0.2)
Basos: 1 %
EOS (ABSOLUTE): 0.4 10*3/uL (ref 0.0–0.4)
Eos: 4 %
Hematocrit: 42 % (ref 37.5–51.0)
Hemoglobin: 13.7 g/dL (ref 13.0–17.7)
Immature Grans (Abs): 0 10*3/uL (ref 0.0–0.1)
Immature Granulocytes: 0 %
Lymphocytes Absolute: 2.3 10*3/uL (ref 0.7–3.1)
Lymphs: 24 %
MCH: 30.1 pg (ref 26.6–33.0)
MCHC: 32.6 g/dL (ref 31.5–35.7)
MCV: 92 fL (ref 79–97)
Monocytes Absolute: 0.6 10*3/uL (ref 0.1–0.9)
Monocytes: 7 %
Neutrophils Absolute: 6.1 10*3/uL (ref 1.4–7.0)
Neutrophils: 64 %
Platelets: 253 10*3/uL (ref 150–450)
RBC: 4.55 x10E6/uL (ref 4.14–5.80)
RDW: 13.4 % (ref 11.6–15.4)
WBC: 9.5 10*3/uL (ref 3.4–10.8)

## 2019-08-30 LAB — LIPID PANEL
Chol/HDL Ratio: 4.5 ratio (ref 0.0–5.0)
Cholesterol, Total: 157 mg/dL (ref 100–199)
HDL: 35 mg/dL — ABNORMAL LOW (ref >39)
LDL Chol Calc (NIH): 96 mg/dL (ref 0–99)
Triglycerides: 149 mg/dL (ref 0–149)
VLDL Cholesterol Cal: 26 mg/dL (ref 5–40)

## 2019-08-30 LAB — BASIC METABOLIC PANEL
BUN/Creatinine Ratio: 13 (ref 10–24)
BUN: 17 mg/dL (ref 8–27)
CO2: 19 mmol/L — ABNORMAL LOW (ref 20–29)
Calcium: 9.4 mg/dL (ref 8.6–10.2)
Chloride: 109 mmol/L — ABNORMAL HIGH (ref 96–106)
Creatinine, Ser: 1.27 mg/dL (ref 0.76–1.27)
GFR calc Af Amer: 64 mL/min/{1.73_m2} (ref >59)
GFR calc non Af Amer: 55 mL/min/{1.73_m2} — ABNORMAL LOW (ref >59)
Glucose: 116 mg/dL — ABNORMAL HIGH (ref 65–99)
Potassium: 4.5 mmol/L (ref 3.5–5.2)
Sodium: 142 mmol/L (ref 134–144)

## 2019-08-30 LAB — HEPATIC FUNCTION PANEL
ALT: 19 IU/L (ref 0–44)
AST: 19 IU/L (ref 0–40)
Albumin: 4.3 g/dL (ref 3.7–4.7)
Alkaline Phosphatase: 28 IU/L — ABNORMAL LOW (ref 48–121)
Bilirubin Total: 0.4 mg/dL (ref 0.0–1.2)
Bilirubin, Direct: 0.14 mg/dL (ref 0.00–0.40)
Total Protein: 7 g/dL (ref 6.0–8.5)

## 2019-08-30 LAB — TSH: TSH: 2.61 u[IU]/mL (ref 0.450–4.500)

## 2019-10-14 ENCOUNTER — Other Ambulatory Visit: Payer: Self-pay | Admitting: Cardiology

## 2020-01-10 DIAGNOSIS — G43009 Migraine without aura, not intractable, without status migrainosus: Secondary | ICD-10-CM | POA: Diagnosis not present

## 2020-01-10 DIAGNOSIS — I10 Essential (primary) hypertension: Secondary | ICD-10-CM | POA: Diagnosis not present

## 2020-01-10 DIAGNOSIS — E782 Mixed hyperlipidemia: Secondary | ICD-10-CM | POA: Diagnosis not present

## 2020-01-10 DIAGNOSIS — E1165 Type 2 diabetes mellitus with hyperglycemia: Secondary | ICD-10-CM | POA: Diagnosis not present

## 2020-03-04 DIAGNOSIS — M62830 Muscle spasm of back: Secondary | ICD-10-CM | POA: Insufficient documentation

## 2020-03-05 ENCOUNTER — Ambulatory Visit (INDEPENDENT_AMBULATORY_CARE_PROVIDER_SITE_OTHER): Payer: Medicare Other | Admitting: Cardiology

## 2020-03-05 ENCOUNTER — Other Ambulatory Visit: Payer: Self-pay

## 2020-03-05 ENCOUNTER — Encounter: Payer: Self-pay | Admitting: Cardiology

## 2020-03-05 VITALS — BP 100/68 | HR 85 | Ht 72.0 in | Wt 254.0 lb

## 2020-03-05 DIAGNOSIS — E119 Type 2 diabetes mellitus without complications: Secondary | ICD-10-CM

## 2020-03-05 DIAGNOSIS — E782 Mixed hyperlipidemia: Secondary | ICD-10-CM

## 2020-03-05 DIAGNOSIS — I10 Essential (primary) hypertension: Secondary | ICD-10-CM | POA: Diagnosis not present

## 2020-03-05 DIAGNOSIS — R931 Abnormal findings on diagnostic imaging of heart and coronary circulation: Secondary | ICD-10-CM

## 2020-03-05 NOTE — Patient Instructions (Signed)
Medication Instructions:  No medication changes. *If you need a refill on your cardiac medications before your next appointment, please call your pharmacy*   Lab Work: Your physician recommends that you return for lab work in: the next few days.  You need to have labs done when you are fasting.  You can come Monday through Friday 8:30 am to 12:00 pm and 1:15 to 4:30. You do not need to make an appointment as the order has already been placed. The labs you are going to have done are BMET, CBC, TSH, A1C, LFT and Lipids.  If you have labs (blood work) drawn today and your tests are completely normal, you will receive your results only by: Marland Kitchen MyChart Message (if you have MyChart) OR . A paper copy in the mail If you have any lab test that is abnormal or we need to change your treatment, we will call you to review the results.   Testing/Procedures: None ordered   Follow-Up: At [facility], you and your health needs are our priority.  As part of our continuing mission to provide you with exceptional heart care, we have created designated Provider Care Teams.  These Care Teams include your primary Cardiologist (physician) and Advanced Practice Providers (APPs -  Physician Assistants and Nurse Practitioners) who all work together to provide you with the care you need, when you need it.  We recommend signing up for the patient portal called "MyChart".  Sign up information is provided on this After Visit Summary.  MyChart is used to connect with patients for Virtual Visits (Telemedicine).  Patients are able to view lab/test results, encounter notes, upcoming appointments, etc.  Non-urgent messages can be sent to your provider as well.   To learn more about what you can do with MyChart, go to [email or web address].    Your next appointment:   6 month(s)  The format for your next appointment:   In Person  Provider:   Belva Crome, MD   Other Instructions NA

## 2020-03-05 NOTE — Progress Notes (Signed)
Cardiology Office Note:    Date:  03/05/2020   ID:  Christian Hall, DOB [birth year], MRN [id number]  PCP:  Lise Auer, MD  Cardiologist:  Garwin Brothers, MD   Referring MD: Lise Auer, MD    ASSESSMENT:    1. Benign essential hypertension   2. Mixed hyperlipidemia   3. Diabetes mellitus without complication (HCC)   4. Elevated coronary artery calcium score    PLAN:    In order of problems listed above:  1. Elevated calcium score: Secondary prevention stressed with the patient.  Importance of compliance with diet medication stressed and vocalized understanding.  Advised to walk at least half an hour a day 5 days a week and he promises to do so. 2. Essential hypertension: Blood pressure stable and diet was emphasized. 3. Mixed dyslipidemia: Diet was emphasized.  Weight reduction stressed.  He'll be back in the next few days for blood work.  Last lipid refill was discussed with him. 4. Obesity and diabetes mellitus: I will check a hemoglobin A1c along with the other labs and send it to primary care physician.  Risks of obesity explained.  I told him that it adversely affects his health in addition to diabetes mellitus and its adverse consequences.  He promises to do better. 5. Patient will be seen in follow-up appointment in 6 months or earlier if the patient has any concerns    Medication Adjustments/Labs and Tests Ordered: Current medicines are reviewed at length with the patient today.  Concerns regarding medicines are outlined above.  Orders Placed This Encounter  Procedures  . Basic metabolic panel  . CBC with Differential/Platelet  . Hepatic function panel  . Lipid panel  . TSH  . Hemoglobin A1c  . EKG 12-Lead   No orders of the defined types were placed in this encounter.    Chief Complaint  Patient presents with  . Follow-up     History of Present Illness:    Christian Hall is a 75 y.o. male.  Patient has past medical history of essential  hypertension, dyslipidemia and diabetes mellitus.  She denies any problems at this time and takes care of activities of daily living.  No chest pain orthopnea or PND.  He leads a sedentary lifestyle.  He has elevated calcium score and is overweight.  At the time of my evaluation, the patient is alert awake oriented and in no distress.  Past Medical History:  Diagnosis Date  . Benign essential hypertension 03/08/2017  . Diabetes mellitus without complication (HCC) 03/08/2017  . Dyspnea on exertion 03/09/2017  . Elevated coronary artery calcium score 08/29/2019  . Hyperlipidemia 03/08/2017  . Muscle spasm of back   . Overweight 08/29/2019  . 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 KIDNEY STONE SURGERY      Current Medications: Current Meds  Medication Sig  . aspirin EC 81 MG tablet Take 81 mg by mouth daily.  . carvedilol (COREG) 3.125 MG tablet Take 1 tablet (3.125 mg total) by mouth 2 (two) times daily.  . cyclobenzaprine (FLEXERIL) 5 MG tablet Take 5 mg by mouth 2 (two) times daily as needed for muscle spasms.  . fenofibrate (TRICOR) 145 MG tablet TAKE 1 TABLET DAILY  . glimepiride (AMARYL) 4 MG tablet Take 4 mg by mouth daily with breakfast.  . JANUMET XR 50-1000 MG TB24 Take 1 tablet by mouth daily.  . pioglitazone (ACTOS) 30 MG tablet Take  30 mg by mouth daily.  . simvastatin (ZOCOR) 40 MG tablet Take 40 mg by mouth daily.  Marland Kitchen topiramate (TOPAMAX) 25 MG tablet Take 25 mg by mouth daily.  . TRULICITY 1.5 MG/0.5ML SOPN Inject 0.5 mLs into the skin once a week.     Allergies:   Hydromorphone   Social History   Socioeconomic History  . Marital status: Married    Spouse name: Not on file  . Number of children: Not on file  . Years of education: Not on file  . Highest education level: Not on file  Occupational History  . Not on file  Tobacco Use  . Smoking status: Former Games developer  . Smokeless tobacco: Never Used  Substance  and Sexual Activity  . Alcohol use: Not on file  . Drug use: Not on file  . Sexual activity: Not on file  Other Topics Concern  . Not on file  Social History Narrative  . Not on file   Social Determinants of Health   Financial Resource Strain: Not on file  Food Insecurity: Not on file  Transportation Needs: Not on file  Physical Activity: Not on file  Stress: Not on file  Social Connections: Not on file     Family History: The patient's family history includes Congestive Heart Failure in his brother; Heart attack in his brother; Hypertension in his brother and mother.  ROS:   Please see the history of present illness.    All other systems reviewed and are negative.  EKGs/Labs/Other Studies Reviewed:    The following studies were reviewed today: IMPRESSION: 1. Coronary calcium score of 1202. This was 85th percentile for age and sex matched control.  2.  Small circumferential pericardial effusion.  3.  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   Electronically Signed   By: Weston Brass   On: 12/31/2018 17:54    Recent Labs: 08/29/2019: ALT 19; BUN 17; Creatinine, Ser 1.27; Hemoglobin 13.7; Platelets 253; Potassium 4.5; Sodium 142; TSH 2.610  Recent Lipid Panel    Component Value Date/Time   CHOL 157 08/29/2019 1332   TRIG 149 08/29/2019 1332   HDL 35 (L) 08/29/2019 1332   CHOLHDL 4.5 08/29/2019 1332   LDLCALC 96 08/29/2019 1332    Physical Exam:    VS:  BP 100/68 (BP Location: Right Arm, Patient Position: Sitting, Cuff Size: Normal)   Pulse 85   Ht 6' (1.829 m)   Wt 254 lb (115.2 kg)   SpO2 97%   BMI 34.45 kg/m     Wt Readings from Last 3 Encounters:  03/05/20 254 lb (115.2 kg)  08/29/19 248 lb 6.4 oz (112.7 kg)  02/01/19 261 lb (118.4 kg)     GEN: Patient is in no acute distress HEENT: Normal NECK: No JVD; No carotid bruits LYMPHATICS: No lymphadenopathy CARDIAC: Hear sounds regular, 2/6 systolic murmur at the  apex. RESPIRATORY:  Clear to auscultation without rales, wheezing or rhonchi  ABDOMEN: Soft, non-tender, non-distended MUSCULOSKELETAL:  No edema; No deformity  SKIN: Warm and dry NEUROLOGIC:  Alert and oriented x 3 PSYCHIATRIC:  Normal affect   Signed, Garwin Brothers, MD  03/05/2020 1:40 PM    [facility]

## 2020-03-09 DIAGNOSIS — E782 Mixed hyperlipidemia: Secondary | ICD-10-CM | POA: Diagnosis not present

## 2020-03-09 DIAGNOSIS — E119 Type 2 diabetes mellitus without complications: Secondary | ICD-10-CM | POA: Diagnosis not present

## 2020-03-09 DIAGNOSIS — I10 Essential (primary) hypertension: Secondary | ICD-10-CM | POA: Diagnosis not present

## 2020-03-10 LAB — BASIC METABOLIC PANEL
BUN/Creatinine Ratio: 12 (ref 10–24)
BUN: 15 mg/dL (ref 8–27)
CO2: 16 mmol/L — ABNORMAL LOW (ref 20–29)
Calcium: 9.8 mg/dL (ref 8.6–10.2)
Chloride: 110 mmol/L — ABNORMAL HIGH (ref 96–106)
Creatinine, Ser: 1.26 mg/dL (ref 0.76–1.27)
Glucose: 106 mg/dL — ABNORMAL HIGH (ref 65–99)
Potassium: 4.1 mmol/L (ref 3.5–5.2)
Sodium: 141 mmol/L (ref 134–144)
eGFR: 59 mL/min/{1.73_m2} — ABNORMAL LOW (ref >59)

## 2020-03-10 LAB — HEPATIC FUNCTION PANEL
ALT: 18 IU/L (ref 0–44)
AST: 18 IU/L (ref 0–40)
Albumin: 4 g/dL (ref 3.7–4.7)
Alkaline Phosphatase: 22 IU/L — ABNORMAL LOW (ref 44–121)
Bilirubin Total: 0.4 mg/dL (ref 0.0–1.2)
Bilirubin, Direct: 0.12 mg/dL (ref 0.00–0.40)
Total Protein: 6.6 g/dL (ref 6.0–8.5)

## 2020-03-10 LAB — LIPID PANEL
Chol/HDL Ratio: 5 ratio (ref 0.0–5.0)
Cholesterol, Total: 170 mg/dL (ref 100–199)
HDL: 34 mg/dL — ABNORMAL LOW (ref >39)
LDL Chol Calc (NIH): 102 mg/dL — ABNORMAL HIGH (ref 0–99)
Triglycerides: 197 mg/dL — ABNORMAL HIGH (ref 0–149)
VLDL Cholesterol Cal: 34 mg/dL (ref 5–40)

## 2020-03-10 LAB — CBC WITH DIFFERENTIAL/PLATELET
Basophils Absolute: 0.1 10*3/uL (ref 0.0–0.2)
Basos: 1 %
EOS (ABSOLUTE): 0.5 10*3/uL — ABNORMAL HIGH (ref 0.0–0.4)
Eos: 5 %
Hematocrit: 38.7 % (ref 37.5–51.0)
Hemoglobin: 12.8 g/dL — ABNORMAL LOW (ref 13.0–17.7)
Immature Grans (Abs): 0 10*3/uL (ref 0.0–0.1)
Immature Granulocytes: 0 %
Lymphocytes Absolute: 2.7 10*3/uL (ref 0.7–3.1)
Lymphs: 30 %
MCH: 29.5 pg (ref 26.6–33.0)
MCHC: 33.1 g/dL (ref 31.5–35.7)
MCV: 89 fL (ref 79–97)
Monocytes Absolute: 0.8 10*3/uL (ref 0.1–0.9)
Monocytes: 9 %
Neutrophils Absolute: 5 10*3/uL (ref 1.4–7.0)
Neutrophils: 55 %
Platelets: 234 10*3/uL (ref 150–450)
RBC: 4.34 x10E6/uL (ref 4.14–5.80)
RDW: 13.3 % (ref 11.6–15.4)
WBC: 9 10*3/uL (ref 3.4–10.8)

## 2020-03-10 LAB — HEMOGLOBIN A1C
Est. average glucose Bld gHb Est-mCnc: 140 mg/dL
Hgb A1c MFr Bld: 6.5 % — ABNORMAL HIGH (ref 4.8–5.6)

## 2020-03-10 LAB — TSH: TSH: 2.48 u[IU]/mL (ref 0.450–4.500)

## 2020-03-10 MED ORDER — ROSUVASTATIN CALCIUM 20 MG PO TABS: 20.0000 mg | ORAL_TABLET | Freq: Every day | ORAL | 3 refills | Status: DC

## 2020-03-10 NOTE — Addendum Note (Signed)
Addended by: Eleonore Chiquito on: 03/10/2020 01:43 PM   Modules accepted: Orders

## 2020-09-03 ENCOUNTER — Other Ambulatory Visit: Payer: Self-pay

## 2020-09-03 DIAGNOSIS — E1140 Type 2 diabetes mellitus with diabetic neuropathy, unspecified: Secondary | ICD-10-CM

## 2020-09-03 DIAGNOSIS — E1129 Type 2 diabetes mellitus with other diabetic kidney complication: Secondary | ICD-10-CM | POA: Insufficient documentation

## 2020-09-03 DIAGNOSIS — N200 Calculus of kidney: Secondary | ICD-10-CM

## 2020-09-03 DIAGNOSIS — L603 Nail dystrophy: Secondary | ICD-10-CM

## 2020-09-03 DIAGNOSIS — E8881 Metabolic syndrome: Secondary | ICD-10-CM

## 2020-09-03 HISTORY — DX: Type 2 diabetes mellitus with diabetic neuropathy, unspecified: E11.40

## 2020-09-03 HISTORY — DX: Nail dystrophy: L60.3

## 2020-09-03 HISTORY — DX: Calculus of kidney: N20.0

## 2020-09-03 HISTORY — DX: Metabolic syndrome: E88.810

## 2020-09-03 HISTORY — DX: Metabolic syndrome: E88.81

## 2020-09-03 HISTORY — DX: Type 2 diabetes mellitus with other diabetic kidney complication: E11.29

## 2020-09-09 ENCOUNTER — Ambulatory Visit (INDEPENDENT_AMBULATORY_CARE_PROVIDER_SITE_OTHER): Payer: Medicare Other | Admitting: Cardiology

## 2020-09-09 ENCOUNTER — Other Ambulatory Visit: Payer: Self-pay

## 2020-09-09 ENCOUNTER — Encounter: Payer: Self-pay | Admitting: Cardiology

## 2020-09-09 VITALS — BP 144/78 | HR 68 | Ht 72.0 in | Wt 249.0 lb

## 2020-09-09 DIAGNOSIS — Z1329 Encounter for screening for other suspected endocrine disorder: Secondary | ICD-10-CM

## 2020-09-09 DIAGNOSIS — E1140 Type 2 diabetes mellitus with diabetic neuropathy, unspecified: Secondary | ICD-10-CM

## 2020-09-09 DIAGNOSIS — E66811 Obesity, class 1: Secondary | ICD-10-CM

## 2020-09-09 DIAGNOSIS — E559 Vitamin D deficiency, unspecified: Secondary | ICD-10-CM | POA: Diagnosis not present

## 2020-09-09 DIAGNOSIS — I10 Essential (primary) hypertension: Secondary | ICD-10-CM

## 2020-09-09 DIAGNOSIS — E669 Obesity, unspecified: Secondary | ICD-10-CM

## 2020-09-09 DIAGNOSIS — R931 Abnormal findings on diagnostic imaging of heart and coronary circulation: Secondary | ICD-10-CM | POA: Diagnosis not present

## 2020-09-09 DIAGNOSIS — E782 Mixed hyperlipidemia: Secondary | ICD-10-CM

## 2020-09-09 DIAGNOSIS — E119 Type 2 diabetes mellitus without complications: Secondary | ICD-10-CM

## 2020-09-09 DIAGNOSIS — E8881 Metabolic syndrome: Secondary | ICD-10-CM

## 2020-09-09 HISTORY — DX: Obesity, unspecified: E66.9

## 2020-09-09 HISTORY — DX: Obesity, class 1: E66.811

## 2020-09-09 NOTE — Patient Instructions (Signed)
Medication Instructions:  Your physician recommends that you continue on your current medications as directed. Please refer to the Current Medication list given to you today.  *If you need a refill on your cardiac medications before your next appointment, please call your pharmacy*   Lab Work: Your physician recommends that you have labs done in the office today. Your test included  basic metabolic panel, complete blood count, hgb A1C, vitamin D, TSH, liver function and lipids.  If you have labs (blood work) drawn today and your tests are completely normal, you will receive your results only by: MyChart Message (if you have MyChart) OR A paper copy in the mail If you have any lab test that is abnormal or we need to change your treatment, we will call you to review the results.   Testing/Procedures: None ordered   Follow-Up: At [facility], you and your health needs are our priority.  As part of our continuing mission to provide you with exceptional heart care, we have created designated Provider Care Teams.  These Care Teams include your primary Cardiologist (physician) and Advanced Practice Providers (APPs -  Physician Assistants and Nurse Practitioners) who all work together to provide you with the care you need, when you need it.  We recommend signing up for the patient portal called "MyChart".  Sign up information is provided on this After Visit Summary.  MyChart is used to connect with patients for Virtual Visits (Telemedicine).  Patients are able to view lab/test results, encounter notes, upcoming appointments, etc.  Non-urgent messages can be sent to your provider as well.   To learn more about what you can do with MyChart, go to [email or web address].    Your next appointment:   6 month(s)  The format for your next appointment:   In Person  Provider:   Belva Crome, MD   Other Instructions NA

## 2020-09-09 NOTE — Progress Notes (Signed)
Cardiology Office Note:    Date:  09/09/2020   ID:  Christian Hall, DOB Sep 27, 1945, MRN [id number]  PCP:  Lise Auer, MD  Cardiologist:  Garwin Brothers, MD   Referring MD: Lise Auer, MD    ASSESSMENT:    1. Diabetes mellitus without complication (HCC)   2. Type 2 diabetes mellitus with diabetic neuropathy, unspecified whether long term insulin use (HCC)   3. Mixed hyperlipidemia   4. Thyroid disorder screen   5. Benign essential hypertension   6. Metabolic syndrome X   7. Vitamin D deficiency   8. Elevated coronary artery calcium score   9. Obesity (BMI 30.0-34.9)    PLAN:    In order of problems listed above:  Elevated calcium score: Secondary prevention stressed with the patient.  Importance of compliance with diet medication stressed and he vocalized understanding.  Importance of regular exercise stressed and he promises to do better. Essential hypertension: Blood pressure stable and diet was emphasized.  Lifestyle modification urged.  Salt intake issues were discussed.  Diet emphasized. Dyslipidemia, diabetes mellitus and obesity: I discussed findings with the patient at length.  Lifestyle modification was urged and he is going to do better with diet and exercise.  We will do complete blood work today including fasting lipids and hemoglobin A1c.  He gives history of vitamin D deficiency in the past and we will add this to the blood work based on his request. Patient will be seen in follow-up appointment in 6 months or earlier if the patient has any concerns    Medication Adjustments/Labs and Tests Ordered: Current medicines are reviewed at length with the patient today.  Concerns regarding medicines are outlined above.  Orders Placed This Encounter  Procedures   Basic metabolic panel   CBC with Differential/Platelet   Hepatic function panel   Hemoglobin A1c   Lipid panel   TSH   VITAMIN D 25 Hydroxy (Vit-D Deficiency, Fractures)    No orders of the defined  types were placed in this encounter.    No chief complaint on file.    History of Present Illness:    Christian Hall is a 75 y.o. male.  Patient has past medical history of elevated calcium score.  He has history of hypertension dyslipidemia diabetes mellitus and obesity.  He strained to do exercise with recumbent bike because of orthopedic issues involving his knee.  He denies any chest pain orthopnea PND.  At the time of my evaluation, the patient is alert awake oriented and in no distress.  Past Medical History:  Diagnosis Date   Benign essential hypertension 03/08/2017   Diabetes mellitus without complication (HCC) 03/08/2017   Dyspnea on exertion 03/09/2017   Elevated coronary artery calcium score 08/29/2019   History of adenomatous polyp of colon 01/04/2000   Jul 28, 2016 Entered By: Polly Cobia Comment: 2002 TA; 12.2009: TA; 7.2018 HP + tics; *Surveillance 2023   Hyperlipidemia 03/08/2017   Kidney stone 09/03/2020   Metabolic syndrome X 09/03/2020   Muscle spasm of back    Nail dystrophy 09/03/2020   Overweight 08/29/2019   Type 2 diabetes mellitus with diabetic neuropathy, unspecified (HCC) 09/03/2020   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   carvedilol (COREG) 3.125 MG tablet Take 1 tablet (3.125 mg total) by mouth 2 (two) times daily.   cyclobenzaprine (FLEXERIL) 5 MG tablet Take 5 mg by mouth 2 (two) times daily as needed for muscle spasms.   diclofenac Sodium (VOLTAREN) 1 % GEL Apply 1 application topically as needed (pain in knees).   fenofibrate (TRICOR) 145 MG tablet Take 145 mg by mouth daily.   glimepiride (AMARYL) 4 MG tablet Take 4 mg by mouth daily with breakfast.   JANUMET XR 50-1000 MG TB24 Take 1  tablet by mouth daily.   pioglitazone (ACTOS) 30 MG tablet Take 30 mg by mouth daily.   rosuvastatin (CRESTOR) 20 MG tablet Take 20 mg by mouth daily.   topiramate (TOPAMAX) 25 MG tablet Take 25 mg by mouth daily.   TRULICITY 1.5 MG/0.5ML SOPN Inject 0.5 mLs into the skin once a week.     Allergies:   Hydromorphone   Social History   Socioeconomic History   Marital status: Married    Spouse name: Not on file   Number of children: Not on file   Years of education: Not on file   Highest education level: Not on file  Occupational History   Not on file  Tobacco Use   Smoking status: Former   Smokeless tobacco: Never  Substance and Sexual Activity   Alcohol use: Not on file   Drug use: Not on file   Sexual activity: Not on file  Other Topics Concern   Not on file  Social History Narrative   Not on file   Social Determinants of Health   Financial Resource Strain: Not on file  Food Insecurity: Not on file  Transportation Needs: Not on file  Physical Activity: Not on file  Stress: Not on file  Social Connections: Not on file     Family History: The patient's family history includes Congestive Heart Failure in his brother; Heart attack in his brother; Hypertension in his brother and mother.  ROS:   Please see the history of present illness.    All other systems reviewed and are negative.  EKGs/Labs/Other Studies Reviewed:    The following studies were reviewed today: I discussed my findings with the patient at length.   Recent Labs: 03/09/2020: ALT 18; BUN 15; Creatinine, Ser 1.26; Hemoglobin 12.8; Platelets 234; Potassium 4.1; Sodium 141; TSH 2.480  Recent Lipid Panel    Component Value Date/Time   CHOL 170 03/09/2020 1001   TRIG 197 (H) 03/09/2020 1001   HDL 34 (L) 03/09/2020 1001   CHOLHDL 5.0 03/09/2020 1001   LDLCALC 102 (H) 03/09/2020 1001    Physical Exam:    VS:  BP (!) 144/78   Pulse 68   Ht 6' (1.829 m)   Wt 249 lb (112.9 kg)   SpO2 96%   BMI  33.77 kg/m     Wt Readings from Last 3 Encounters:  09/09/20 249 lb (112.9 kg)  03/05/20 254 lb (115.2 kg)  08/29/19 248 lb 6.4 oz (112.7 kg)     GEN: Patient is in no acute distress HEENT: Normal NECK: No JVD; No carotid bruits LYMPHATICS: No lymphadenopathy CARDIAC: Hear sounds regular, 2/6 systolic murmur at the apex. RESPIRATORY:  Clear to auscultation without rales, wheezing or rhonchi  ABDOMEN: Soft, non-tender, non-distended MUSCULOSKELETAL:  No edema; No deformity  SKIN: Warm and dry NEUROLOGIC:  Alert and oriented x 3 PSYCHIATRIC:  Normal affect   Signed, Aundra Dubin Railynn Ballo,  MD  09/09/2020 2:12 PM    [facility]

## 2020-09-10 LAB — CBC WITH DIFFERENTIAL/PLATELET
Basophils Absolute: 0.1 10*3/uL (ref 0.0–0.2)
Basos: 1 %
EOS (ABSOLUTE): 0.4 10*3/uL (ref 0.0–0.4)
Eos: 4 %
Hematocrit: 42.5 % (ref 37.5–51.0)
Hemoglobin: 13.9 g/dL (ref 13.0–17.7)
Immature Grans (Abs): 0 10*3/uL (ref 0.0–0.1)
Immature Granulocytes: 0 %
Lymphocytes Absolute: 2.5 10*3/uL (ref 0.7–3.1)
Lymphs: 25 %
MCH: 29.9 pg (ref 26.6–33.0)
MCHC: 32.7 g/dL (ref 31.5–35.7)
MCV: 91 fL (ref 79–97)
Monocytes Absolute: 0.7 10*3/uL (ref 0.1–0.9)
Monocytes: 7 %
Neutrophils Absolute: 6.3 10*3/uL (ref 1.4–7.0)
Neutrophils: 63 %
Platelets: 246 10*3/uL (ref 150–450)
RBC: 4.65 x10E6/uL (ref 4.14–5.80)
RDW: 13.4 % (ref 11.6–15.4)
WBC: 10.1 10*3/uL (ref 3.4–10.8)

## 2020-09-10 LAB — TSH: TSH: 2.1 u[IU]/mL (ref 0.450–4.500)

## 2020-09-10 LAB — BASIC METABOLIC PANEL
BUN/Creatinine Ratio: 15 (ref 10–24)
BUN: 21 mg/dL (ref 8–27)
CO2: 19 mmol/L — ABNORMAL LOW (ref 20–29)
Calcium: 9.9 mg/dL (ref 8.6–10.2)
Chloride: 104 mmol/L (ref 96–106)
Creatinine, Ser: 1.37 mg/dL — ABNORMAL HIGH (ref 0.76–1.27)
Glucose: 106 mg/dL — ABNORMAL HIGH (ref 65–99)
Potassium: 4.6 mmol/L (ref 3.5–5.2)
Sodium: 139 mmol/L (ref 134–144)
eGFR: 54 mL/min/{1.73_m2} — ABNORMAL LOW (ref >59)

## 2020-09-10 LAB — HEPATIC FUNCTION PANEL
ALT: 26 IU/L (ref 0–44)
AST: 28 IU/L (ref 0–40)
Albumin: 4.4 g/dL (ref 3.7–4.7)
Alkaline Phosphatase: 27 IU/L — ABNORMAL LOW (ref 44–121)
Bilirubin Total: 0.5 mg/dL (ref 0.0–1.2)
Bilirubin, Direct: 0.15 mg/dL (ref 0.00–0.40)
Total Protein: 7 g/dL (ref 6.0–8.5)

## 2020-09-10 LAB — HEMOGLOBIN A1C
Est. average glucose Bld gHb Est-mCnc: 143 mg/dL
Hgb A1c MFr Bld: 6.6 % — ABNORMAL HIGH (ref 4.8–5.6)

## 2020-09-10 LAB — LIPID PANEL
Chol/HDL Ratio: 3.5 ratio (ref 0.0–5.0)
Cholesterol, Total: 138 mg/dL (ref 100–199)
HDL: 39 mg/dL — ABNORMAL LOW (ref >39)
LDL Chol Calc (NIH): 78 mg/dL (ref 0–99)
Triglycerides: 114 mg/dL (ref 0–149)
VLDL Cholesterol Cal: 21 mg/dL (ref 5–40)

## 2020-09-10 LAB — VITAMIN D 25 HYDROXY (VIT D DEFICIENCY, FRACTURES): Vit D, 25-Hydroxy: 34.9 ng/mL (ref 30.0–100.0)

## 2020-09-18 DIAGNOSIS — Z1331 Encounter for screening for depression: Secondary | ICD-10-CM | POA: Diagnosis not present

## 2020-09-18 DIAGNOSIS — E1165 Type 2 diabetes mellitus with hyperglycemia: Secondary | ICD-10-CM | POA: Diagnosis not present

## 2020-09-18 DIAGNOSIS — Z6834 Body mass index (BMI) 34.0-34.9, adult: Secondary | ICD-10-CM | POA: Diagnosis not present

## 2020-09-18 DIAGNOSIS — R319 Hematuria, unspecified: Secondary | ICD-10-CM | POA: Diagnosis not present

## 2020-09-18 DIAGNOSIS — Z0000 Encounter for general adult medical examination without abnormal findings: Secondary | ICD-10-CM | POA: Diagnosis not present

## 2020-10-08 ENCOUNTER — Other Ambulatory Visit: Payer: Self-pay | Admitting: Cardiology

## 2020-10-08 NOTE — Telephone Encounter (Signed)
Fenofibrate 145 mg tablets # 90 x 3 refill sent to  [facility] - [place], [place] - [address]

## 2020-10-16 DIAGNOSIS — Z1211 Encounter for screening for malignant neoplasm of colon: Secondary | ICD-10-CM | POA: Diagnosis not present

## 2021-02-09 DIAGNOSIS — Z79899 Other long term (current) drug therapy: Secondary | ICD-10-CM | POA: Diagnosis not present

## 2021-02-09 DIAGNOSIS — R1011 Right upper quadrant pain: Secondary | ICD-10-CM | POA: Diagnosis not present

## 2021-02-09 DIAGNOSIS — Z87442 Personal history of urinary calculi: Secondary | ICD-10-CM | POA: Diagnosis not present

## 2021-02-09 DIAGNOSIS — R310 Gross hematuria: Secondary | ICD-10-CM | POA: Diagnosis not present

## 2021-02-09 DIAGNOSIS — N401 Enlarged prostate with lower urinary tract symptoms: Secondary | ICD-10-CM | POA: Diagnosis not present

## 2021-02-15 ENCOUNTER — Other Ambulatory Visit: Payer: Self-pay | Admitting: Cardiology

## 2021-02-16 DIAGNOSIS — I700 Atherosclerosis of aorta: Secondary | ICD-10-CM | POA: Diagnosis not present

## 2021-02-16 DIAGNOSIS — R911 Solitary pulmonary nodule: Secondary | ICD-10-CM | POA: Diagnosis not present

## 2021-02-16 DIAGNOSIS — N202 Calculus of kidney with calculus of ureter: Secondary | ICD-10-CM | POA: Diagnosis not present

## 2021-02-16 DIAGNOSIS — N200 Calculus of kidney: Secondary | ICD-10-CM | POA: Diagnosis not present

## 2021-02-16 DIAGNOSIS — K5730 Diverticulosis of large intestine without perforation or abscess without bleeding: Secondary | ICD-10-CM | POA: Diagnosis not present

## 2021-02-24 DIAGNOSIS — N200 Calculus of kidney: Secondary | ICD-10-CM | POA: Diagnosis not present

## 2021-02-24 DIAGNOSIS — N401 Enlarged prostate with lower urinary tract symptoms: Secondary | ICD-10-CM | POA: Diagnosis not present

## 2021-02-24 DIAGNOSIS — R310 Gross hematuria: Secondary | ICD-10-CM | POA: Diagnosis not present

## 2021-03-05 DIAGNOSIS — R319 Hematuria, unspecified: Secondary | ICD-10-CM | POA: Diagnosis not present

## 2021-03-05 DIAGNOSIS — E119 Type 2 diabetes mellitus without complications: Secondary | ICD-10-CM | POA: Diagnosis not present

## 2021-03-05 DIAGNOSIS — N200 Calculus of kidney: Secondary | ICD-10-CM | POA: Diagnosis not present

## 2021-03-05 DIAGNOSIS — I878 Other specified disorders of veins: Secondary | ICD-10-CM | POA: Diagnosis not present

## 2021-03-05 DIAGNOSIS — I499 Cardiac arrhythmia, unspecified: Secondary | ICD-10-CM | POA: Diagnosis not present

## 2021-03-09 ENCOUNTER — Other Ambulatory Visit: Payer: Self-pay

## 2021-03-10 ENCOUNTER — Ambulatory Visit (INDEPENDENT_AMBULATORY_CARE_PROVIDER_SITE_OTHER): Payer: Medicare PPO | Admitting: Cardiology

## 2021-03-10 ENCOUNTER — Encounter: Payer: Self-pay | Admitting: Cardiology

## 2021-03-10 ENCOUNTER — Other Ambulatory Visit: Payer: Self-pay

## 2021-03-10 VITALS — BP 160/72 | HR 86 | Ht 72.0 in | Wt 256.2 lb

## 2021-03-10 DIAGNOSIS — I493 Ventricular premature depolarization: Secondary | ICD-10-CM | POA: Diagnosis not present

## 2021-03-10 DIAGNOSIS — R931 Abnormal findings on diagnostic imaging of heart and coronary circulation: Secondary | ICD-10-CM

## 2021-03-10 DIAGNOSIS — I10 Essential (primary) hypertension: Secondary | ICD-10-CM | POA: Diagnosis not present

## 2021-03-10 DIAGNOSIS — E782 Mixed hyperlipidemia: Secondary | ICD-10-CM | POA: Diagnosis not present

## 2021-03-10 DIAGNOSIS — E119 Type 2 diabetes mellitus without complications: Secondary | ICD-10-CM

## 2021-03-10 DIAGNOSIS — E669 Obesity, unspecified: Secondary | ICD-10-CM

## 2021-03-10 DIAGNOSIS — E1140 Type 2 diabetes mellitus with diabetic neuropathy, unspecified: Secondary | ICD-10-CM | POA: Diagnosis not present

## 2021-03-10 DIAGNOSIS — Z01810 Encounter for preprocedural cardiovascular examination: Secondary | ICD-10-CM | POA: Diagnosis not present

## 2021-03-10 HISTORY — DX: Encounter for preprocedural cardiovascular examination: Z01.810

## 2021-03-10 NOTE — Progress Notes (Signed)
?Cardiology Office Note:   ? ?Date:  03/10/2021  ? ?ID:  Christian Hall, DOB 02/05/1945, MRN [id number] ? ?PCP:  Christian Hall  ?Cardiologist:  Christian Hall  ? ?Referring Hall: Christian Hall  ? ? ?ASSESSMENT:   ? ?1. Mixed hyperlipidemia   ?2. Type 2 diabetes mellitus with diabetic neuropathy, unspecified whether long term insulin use (HCC)   ?3. Benign essential hypertension   ?4. Elevated coronary artery calcium score   ?5. Ventricular premature contractions   ?6. Diabetes mellitus without complication (HCC)   ?7. Obesity (BMI 30.0-34.9)   ?8. Preoperative cardiovascular examination   ? ?PLAN:   ? ?In order of problems listed above: ? ?Elevated calcium score: Secondary prevention stressed with patient.  Importance of compliance with diet medication stressed any vocalized understanding. ?Preop assessment performed lithotripsy: In my opinion is not at high risk for coronary events during the aforementioned procedure.  He has excellent effort tolerance and doing well.  Meticulous hemodynamic monitoring will further reduce the risk of coronary events. ?Essential hypertension: Patient has an element of whitecoat hypertension.  He tells me that his blood pressures at home are fine and he mentioned to me the readings.  Diet especially salt intake issues were discussed with him at length. ?Mixed dyslipidemia: On statin therapy.  Lipids followed by primary care.  He had blood work done at [facility] and he will get me a copy. ?Obesity and diabetes mellitus: Risks explained and he promises to do better with regular exercise and lifestyle modification. ?Patient will be seen in follow-up appointment in 9 months or earlier if the patient has any concerns ? ? ? ?Medication Adjustments/Labs and Tests Ordered: ?Current medicines are reviewed at length with the patient today.  Concerns regarding medicines are outlined above.  ?Orders Placed This Encounter  ?Procedures  ? EKG 12-Lead  ? ?No orders of the defined types were  placed in this encounter. ? ? ? ?No chief complaint on file. ?  ? ?History of Present Illness:   ? ?Christian Hall is a 76 y.o. male.  Patient has past medical history of elevated calcium score, essential hypertension, dyslipidemia and diabetes mellitus and obesity.  He denies any problems at this time and takes care of activities of daily living.  No chest pain orthopnea or PND.  At the time of my evaluation, the patient is alert awake oriented and in no distress.  He walks on a regular basis.  He tells me that he walks more than half an hour a day about 2 to 3 days a week.  At the time of my evaluation, the patient is alert awake oriented and in no distress. ? ?Past Medical History:  ?Diagnosis Date  ? Benign essential hypertension 03/08/2017  ? Diabetes mellitus without complication (HCC) 03/08/2017  ? Dyspnea on exertion 03/09/2017  ? Elevated coronary artery calcium score 08/29/2019  ? History of adenomatous polyp of colon 01/04/2000  ? Jul 28, 2016 Entered By: Christian Hall Comment: 2002 TA; 12.2009: TA; 7.2018 HP + tics; *Surveillance 2023  ? Hyperlipidemia 03/08/2017  ? Kidney stone 09/03/2020  ? Metabolic syndrome X 09/03/2020  ? Muscle spasm of back   ? Nail dystrophy 09/03/2020  ? Obesity (BMI 30.0-34.9) 09/09/2020  ? Overweight 08/29/2019  ? Type 2 diabetes mellitus with diabetic neuropathy, unspecified (HCC) 09/03/2020  ? 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 ? ?Past Surgical  History:  ?Procedure Laterality Date  ? KIDNEY STONE SURGERY    ? ? ?Current Medications: ?Current Meds  ?Medication Sig  ? carvedilol (COREG) 3.125 MG tablet Take 1 tablet (3.125 mg total) by mouth 2 (two) times daily.  ? fenofibrate (TRICOR) 145 MG tablet Take 145 mg by mouth daily.  ? glimepiride (AMARYL) 4 MG tablet Take 4 mg by mouth daily with breakfast.  ? JANUMET XR 50-1000 MG TB24 Take 1 tablet by mouth daily.  ? pioglitazone (ACTOS) 30 MG tablet Take  30 mg by mouth daily.  ? rosuvastatin (CRESTOR) 20 MG tablet Take 1 tablet (20 mg total) by mouth daily.  ? TRULICITY 1.5 MG/0.5ML SOPN Inject 0.5 mLs into the skin once a week.  ?  ? ?Allergies:   Hydromorphone  ? ?Social History  ? ?Socioeconomic History  ? Marital status: Married  ?  Spouse name: Not on file  ? Number of children: Not on file  ? Years of education: Not on file  ? Highest education level: Not on file  ?Occupational History  ? Not on file  ?Tobacco Use  ? Smoking status: Former  ? Smokeless tobacco: Never  ?Substance and Sexual Activity  ? Alcohol use: Not on file  ? Drug use: Not on file  ? Sexual activity: Not on file  ?Other Topics Concern  ? Not on file  ?Social History Narrative  ? Not on file  ? ?Social Determinants of Health  ? ?Financial Resource Strain: Not on file  ?Food Insecurity: Not on file  ?Transportation Needs: Not on file  ?Physical Activity: Not on file  ?Stress: Not on file  ?Social Connections: Not on file  ?  ? ?Family History: ?The patient's family history includes Congestive Heart Failure in his brother; Heart attack in his brother; Hypertension in his brother and mother. ? ?ROS:   ?Please see the history of present illness.    ?All other systems reviewed and are negative. ? ?EKGs/Labs/Other Studies Reviewed:   ? ?The following studies were reviewed today: ?IMPRESSION: ?1. Coronary calcium score of 1202. This was 85th percentile for age ?and sex matched control. ?  ?2.  Small circumferential pericardial effusion. ?  ?3.  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 ?  ?  ?Electronically Signed ?  By: Christian Hall ?  On: 12/31/2018 17:54 ? ? ?Recent Labs: ?09/09/2020: ALT 26; BUN 21; Creatinine, Ser 1.37; Hemoglobin 13.9; Platelets 246; Potassium 4.6; Sodium 139; TSH 2.100  ?Recent Lipid Panel ?   ?Component Value Date/Time  ? CHOL 138 09/09/2020 1414  ? TRIG 114 09/09/2020 1414  ? HDL 39 (L) 09/09/2020 1414  ? CHOLHDL 3.5 09/09/2020 1414  ? LDLCALC 78  09/09/2020 1414  ? ? ?Physical Exam:   ? ?VS:  BP (!) 160/72   Pulse 86   Ht 6' (1.829 Hall)   Wt 256 lb 3.2 oz (116.2 kg)   SpO2 93%   BMI 34.75 kg/Hall?    ? ?Wt Readings from Last 3 Encounters:  ?03/10/21 256 lb 3.2 oz (116.2 kg)  ?09/09/20 249 lb (112.9 kg)  ?03/05/20 254 lb (115.2 kg)  ?  ? ?GEN: Patient is in no acute distress ?HEENT: Normal ?NECK: No JVD; No carotid bruits ?LYMPHATICS: No lymphadenopathy ?CARDIAC: Hear sounds regular, 2/6 systolic murmur at the apex. ?RESPIRATORY:  Clear to auscultation without rales, wheezing or rhonchi  ?ABDOMEN: Soft, non-tender, non-distended ?MUSCULOSKELETAL:  No edema; No deformity  ?SKIN: Warm and dry ?NEUROLOGIC:  Alert  and oriented x 3 ?PSYCHIATRIC:  Normal affect  ? ?Signed, ?Christian Brothers, Hall  ?03/10/2021 2:59 PM    ?[facility]  ?

## 2021-03-10 NOTE — Patient Instructions (Signed)

## 2021-03-16 ENCOUNTER — Telehealth: Payer: Self-pay

## 2021-03-16 NOTE — Telephone Encounter (Signed)
? ? ?  Patient Name: Christian Hall  ?DOB: 1945/06/29 ?MRN: [id number] ? ?Primary Cardiologist: Jenean Lindau, MD ? ?Chart reviewed as part of pre-operative protocol coverage. Given past medical history and time since last visit, based on ACC/AHA guidelines, Christian Hall would be at acceptable risk for the planned procedure without further cardiovascular testing.  Patient was recently seen by Dr. Geraldo Pitter on 03/10/2021 who has cleared the patient for procedure. ? ?The patient was advised that if he develops new symptoms prior to surgery to contact our office to arrange for a follow-up visit, and he verbalized understanding. ? ?Patient may hold aspirin for 5 to 7 days prior to the procedure and restart as soon as possible afterward at the surgeon's discretion. ? ?I will route this recommendation to the requesting party via Epic fax function and remove from pre-op pool. ? ?Please call with questions. ? ?Almyra Deforest, [place] ?03/16/2021, 1:54 PM ? ?

## 2021-03-16 NOTE — Telephone Encounter (Signed)
? ?  Pre-operative Risk Assessment  ?  ?Patient Name: Christian Hall  ?DOB: 1945-09-09 ?MRN: [id number]  ? ?  ? ?Request for Surgical Clearance   ? ?Procedure:   lithotripsy ? ?Date of Surgery:  Clearance TBD                              ?   ?Surgeon:  Dr. Delmer Islam ?Surgeon's Group or Practice Name:  [facility] ?Phone number:  [phone] ?Fax number:  [phone] Attention Tresa Endo ?  ?Type of Clearance Requested:   ?- Pharmacy:  Hold Aspirin 5-7 days prior to procedure ?  ?Type of Anesthesia:  Not Indicated ?  ?Additional requests/questions:   ? ?Signed, ?Len Kluver Myrtie Soman   ?03/16/2021, 12:06 PM  ? ?

## 2021-03-18 NOTE — Telephone Encounter (Signed)
I will re-fax clearance notes today to fax # that was given to [place] on the clearance form.  ?

## 2021-03-18 NOTE — Telephone Encounter (Signed)
Patient is following up. He states [facility] never received our recommendation. He would like to know if it can be faxed again. ?

## 2021-03-28 ENCOUNTER — Encounter: Payer: Self-pay | Admitting: Cardiology

## 2021-04-02 DIAGNOSIS — E119 Type 2 diabetes mellitus without complications: Secondary | ICD-10-CM | POA: Diagnosis not present

## 2021-04-02 DIAGNOSIS — N200 Calculus of kidney: Secondary | ICD-10-CM | POA: Diagnosis not present

## 2021-04-13 DIAGNOSIS — N200 Calculus of kidney: Secondary | ICD-10-CM | POA: Diagnosis not present

## 2021-04-13 DIAGNOSIS — N401 Enlarged prostate with lower urinary tract symptoms: Secondary | ICD-10-CM | POA: Diagnosis not present

## 2021-04-13 DIAGNOSIS — I878 Other specified disorders of veins: Secondary | ICD-10-CM | POA: Diagnosis not present

## 2021-05-21 DIAGNOSIS — N401 Enlarged prostate with lower urinary tract symptoms: Secondary | ICD-10-CM | POA: Diagnosis not present

## 2021-05-21 DIAGNOSIS — N200 Calculus of kidney: Secondary | ICD-10-CM | POA: Diagnosis not present

## 2021-06-08 ENCOUNTER — Telehealth: Payer: Self-pay | Admitting: Cardiology

## 2021-06-08 ENCOUNTER — Other Ambulatory Visit: Payer: Self-pay

## 2021-06-08 ENCOUNTER — Telehealth: Payer: Self-pay

## 2021-06-08 MED ORDER — ROSUVASTATIN CALCIUM 20 MG PO TABS: 20.0000 mg | ORAL_TABLET | Freq: Every day | ORAL | 3 refills | Status: DC

## 2021-06-08 NOTE — Telephone Encounter (Signed)
*  STAT* If patient is at the pharmacy, call can be transferred to refill team.   1. Which medications need to be refilled? (please list name of each medication and dose if known) rosuvastatin (CRESTOR) 20 MG tablet  2. Which pharmacy/location (including street and city if local pharmacy) is medication to be sent to? [facility] - [place], [place]  3. Do they need a 30 day or 90 day supply? 20  [employer] is no longer allowing him to use [facility], so for now he would like this RX sent to the [facility] until further notice.

## 2021-06-08 NOTE — Telephone Encounter (Signed)
Crestor 20mg  #90 3 ref sent to [facility] at pt request.

## 2021-06-11 ENCOUNTER — Telehealth: Payer: Self-pay

## 2021-06-11 ENCOUNTER — Telehealth: Payer: Self-pay | Admitting: Cardiology

## 2021-06-11 MED ORDER — ROSUVASTATIN CALCIUM 20 MG PO TABS: 20.0000 mg | ORAL_TABLET | Freq: Every day | ORAL | 3 refills | Status: DC

## 2021-06-11 NOTE — Telephone Encounter (Signed)
Crestor 20mg  #90 ref x 3 sent to [facility]

## 2021-06-11 NOTE — Telephone Encounter (Signed)
*  STAT* If patient is at the pharmacy, call can be transferred to refill team.   1. Which medications need to be refilled? (please list name of each medication and dose if known) rosuvastatin (CRESTOR) 20 MG tablet   2. Which pharmacy/location (including street and city if local pharmacy) is medication to be sent to?  [facility] - [place], [place] - [address]       3. Do they need a 30 day or 90 day supply? 90  

## 2021-08-01 DIAGNOSIS — B029 Zoster without complications: Secondary | ICD-10-CM | POA: Diagnosis not present

## 2021-08-01 DIAGNOSIS — R21 Rash and other nonspecific skin eruption: Secondary | ICD-10-CM | POA: Diagnosis not present

## 2021-08-01 DIAGNOSIS — R0782 Intercostal pain: Secondary | ICD-10-CM | POA: Diagnosis not present

## 2021-08-05 DIAGNOSIS — E1129 Type 2 diabetes mellitus with other diabetic kidney complication: Secondary | ICD-10-CM | POA: Diagnosis not present

## 2021-08-05 DIAGNOSIS — Z6833 Body mass index (BMI) 33.0-33.9, adult: Secondary | ICD-10-CM | POA: Diagnosis not present

## 2021-08-05 DIAGNOSIS — R0789 Other chest pain: Secondary | ICD-10-CM | POA: Diagnosis not present

## 2021-08-05 DIAGNOSIS — R809 Proteinuria, unspecified: Secondary | ICD-10-CM | POA: Diagnosis not present

## 2021-09-08 DIAGNOSIS — N200 Calculus of kidney: Secondary | ICD-10-CM | POA: Diagnosis not present

## 2021-09-08 DIAGNOSIS — N401 Enlarged prostate with lower urinary tract symptoms: Secondary | ICD-10-CM | POA: Diagnosis not present

## 2021-09-09 ENCOUNTER — Other Ambulatory Visit: Payer: Self-pay | Admitting: Cardiology

## 2021-09-25 IMAGING — CT CT HEART SCORING
2 series · 15 of 20 positions shown, 17 images · non-contrast
Comparison: None.
COMPARISON: None.

Addendum:
EXAM:
OVER-READ INTERPRETATION  CT CHEST

The following report is an over-read performed by radiologist Dr.
Jaquan Asay [REDACTED] on 12/31/2018. This
over-read does not include interpretation of cardiac or coronary
anatomy or pathology. The coronary calcium score/coronary CTA
interpretation by the cardiologist is attached.
CLINICAL DATA: Risk stratification, CAD eval, asymptomatic, family
hx
Coronary Calcium Score
TECHNIQUE: The patient was scanned on a Siemens Force scanner. Axial
non-contrast 3 mm slices were carried out through the heart. The
data set was analyzed on a dedicated work station and scored using
the Agatson method.

[Series 2: casc 3.0 i36f 2 bestdiast 71 % · axial · 0.44mm/px · z∈[-264,-144]mm · 8 of 52 slices shown, 10 images]
[im 6/52  vessel]
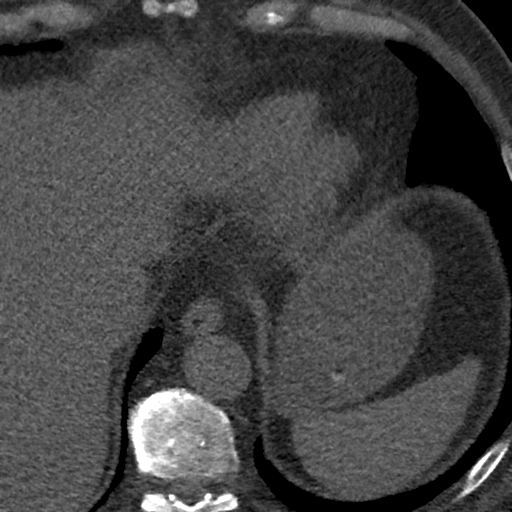
[im 6/52  lung]
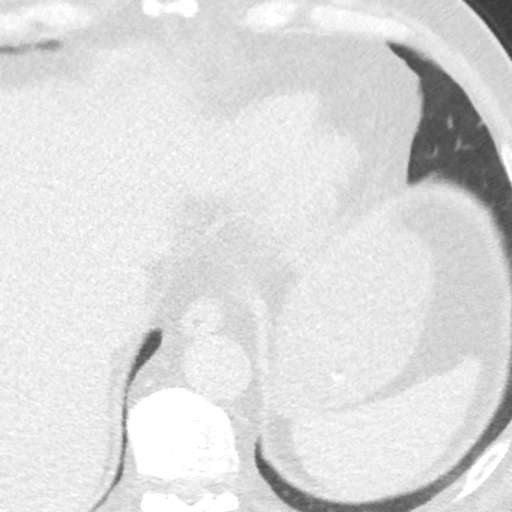
[im 12/52  vessel]
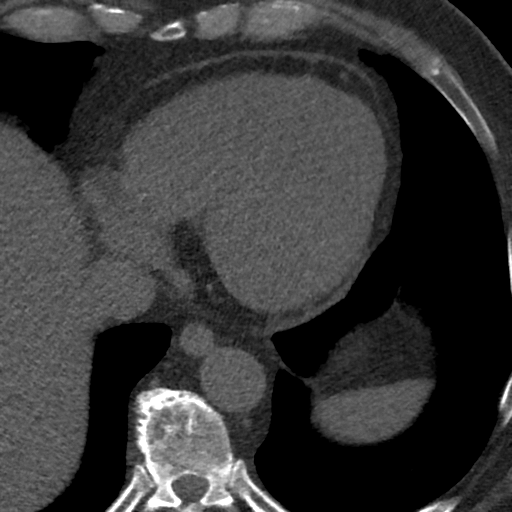
[im 18/52  vessel]
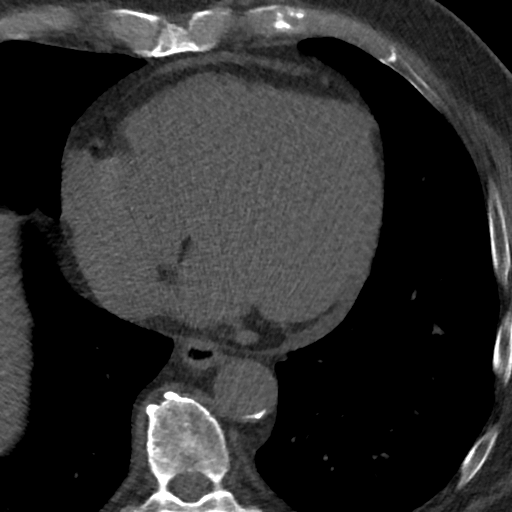
[im 23/52  vessel]
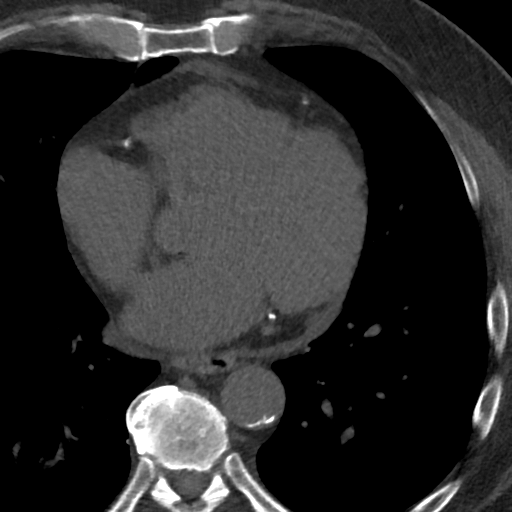
[im 29/52  vessel]
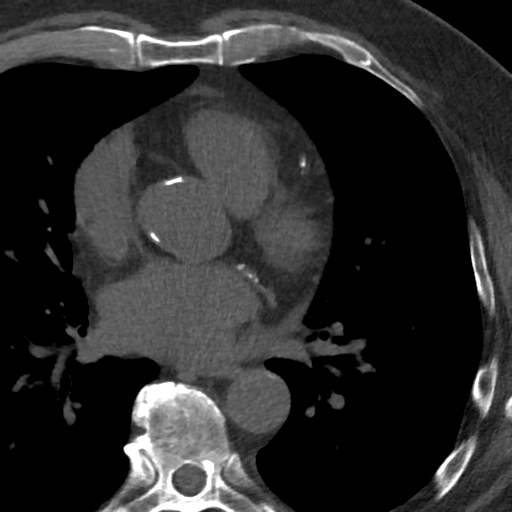
[im 29/52  lung]
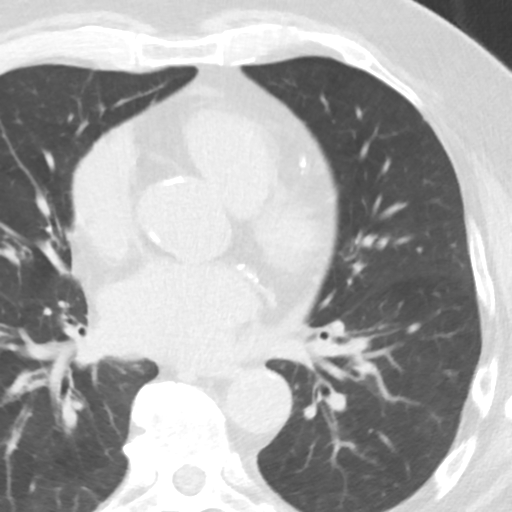
[im 35/52  vessel]
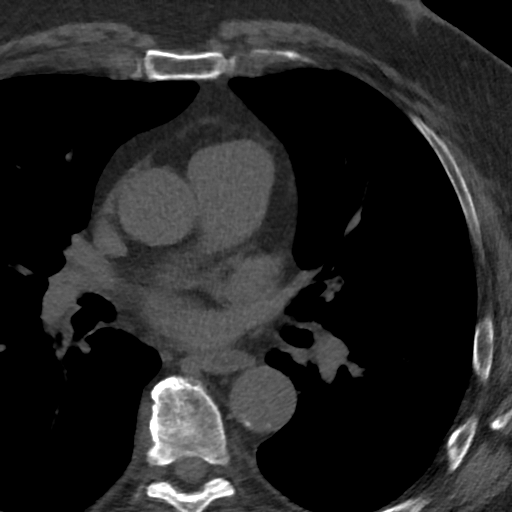
[im 40/52  vessel]
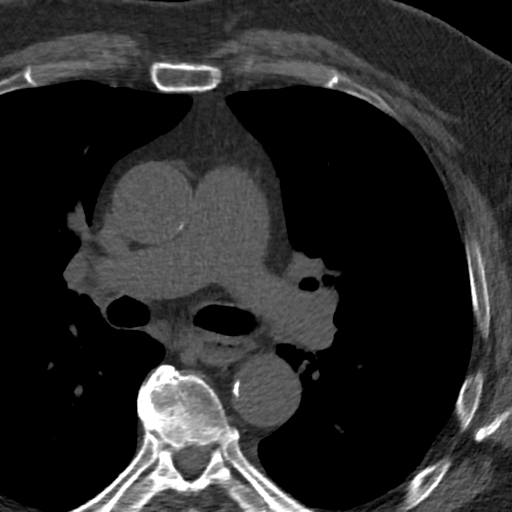
[im 46/52  vessel]
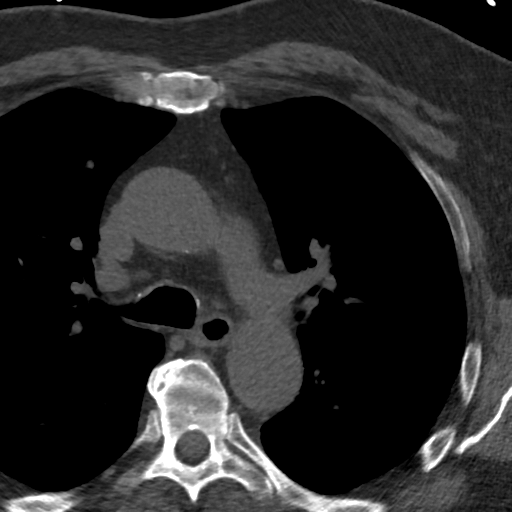

[Series 4: lung st 71 % · axial · 0.80mm/px · z∈[-264,-162]mm · 7 of 52 slices shown]
[im 6/52  lung]
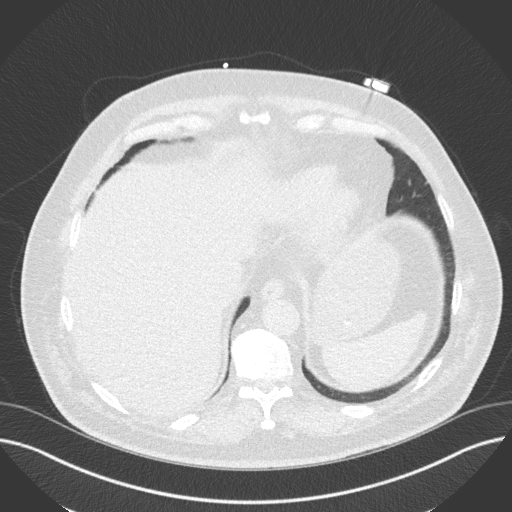
[im 12/52  lung]
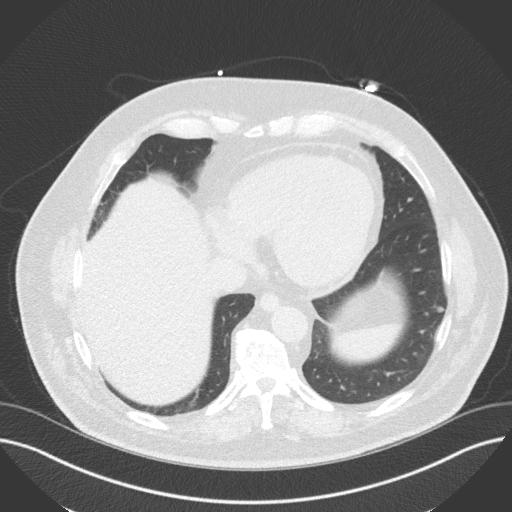
[im 18/52  lung]
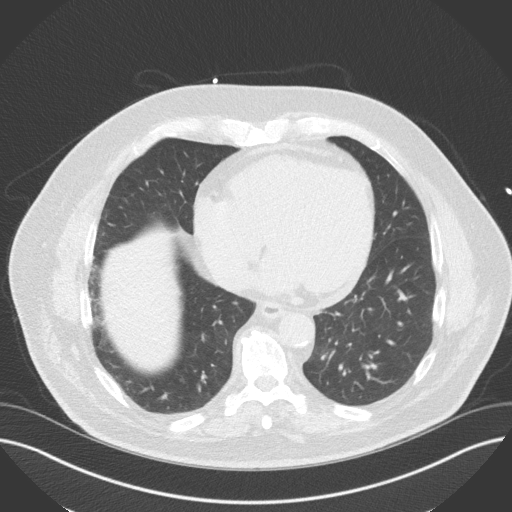
[im 23/52  lung]
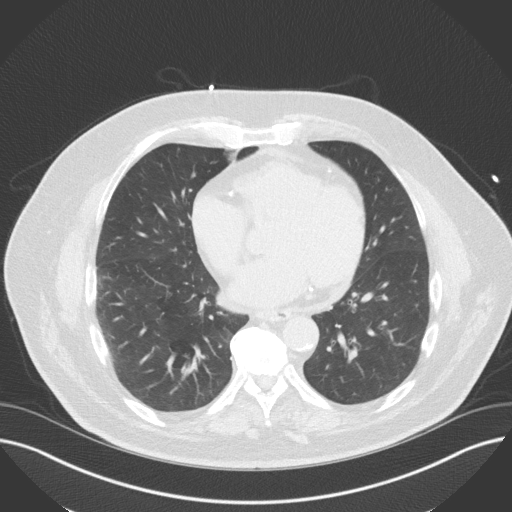
[im 29/52  lung]
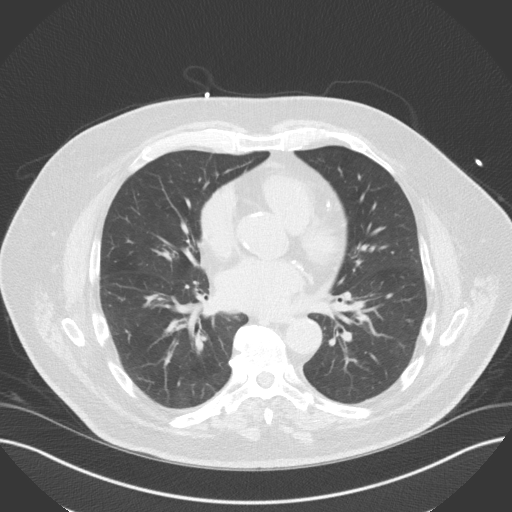
[im 35/52  lung]
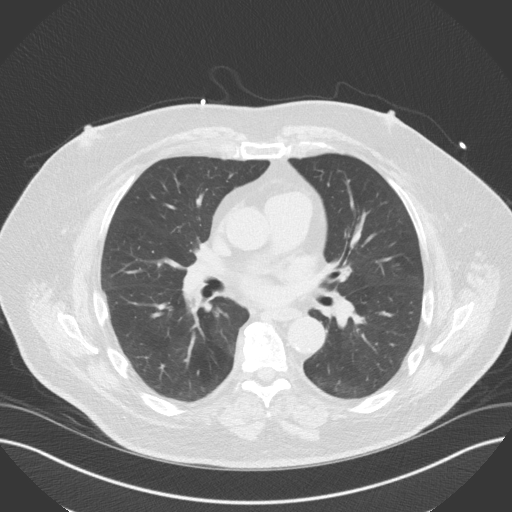
[im 40/52  lung]
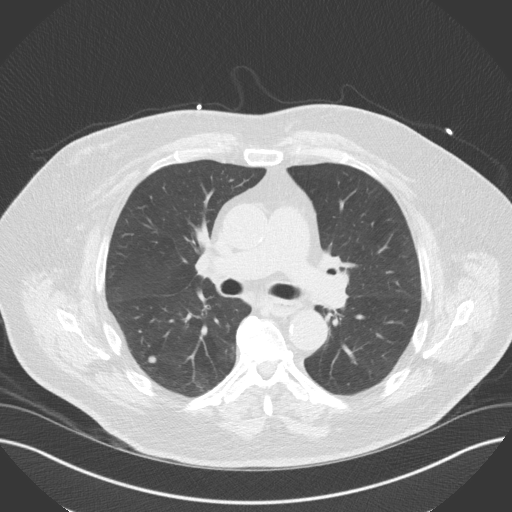

[15 of 20 positions shown; findings below may reference images not displayed]

FINDINGS: Aortic atherosclerosis. Multiple tiny pulmonary nodules measuring 5
mm or less in size. Small calcified granuloma in the right middle
lobe. Within the visualized portions of the thorax there are no
suspicious appearing pulmonary nodules or masses, there is no acute
consolidative airspace disease, no pleural effusions, no
pneumothorax and no lymphadenopathy. Visualized portions of the
upper abdomen demonstrates diffuse low attenuation throughout the
visualized hepatic parenchyma. There are no aggressive appearing
lytic or blastic lesions noted in the visualized portions of the
skeleton.
IMPRESSION: 1. Small pulmonary nodules scattered throughout the lungs
bilaterally measuring 5 mm or less in size, nonspecific, but
statistically likely benign. No follow-up needed if patient is
low-risk (and has no known or suspected primary neoplasm).
Non-contrast chest CT can be considered in 12 months if patient is
high-risk. This recommendation follows the consensus statement:
Guidelines for Management of Incidental Pulmonary Nodules Detected
2. Aortic Atherosclerosis (CU4B3-WW7.7).
3. Hepatic steatosis.
FINDINGS: Non-cardiac: See separate report from [REDACTED].

Ascending Aorta: 35 mm at the mid ascending aorta measured in an
axial plane. Aortic mixed atherosclerosis.

Pericardium: Small circumferential pericardial effusion.

Coronary arteries:

Coronary calcium score of 7535. This was 85th percentile for age and
sex matched control.

Moderately dilated main pulmonary artery measuring 35 mm.
IMPRESSION: 1. Coronary calcium score of 7535. This was 85th percentile for age
and sex matched control.

2.  Small circumferential pericardial effusion.

3.  Moderately dilated main pulmonary artery measuring 35 mm.

*** End of Addendum ***
EXAM:
OVER-READ INTERPRETATION  CT CHEST

The following report is an over-read performed by radiologist Dr.
Jaquan Asay [REDACTED] on 12/31/2018. This
over-read does not include interpretation of cardiac or coronary
anatomy or pathology. The coronary calcium score/coronary CTA
interpretation by the cardiologist is attached.
FINDINGS: Aortic atherosclerosis. Multiple tiny pulmonary nodules measuring 5
mm or less in size. Small calcified granuloma in the right middle
lobe. Within the visualized portions of the thorax there are no
suspicious appearing pulmonary nodules or masses, there is no acute
consolidative airspace disease, no pleural effusions, no
pneumothorax and no lymphadenopathy. Visualized portions of the
upper abdomen demonstrates diffuse low attenuation throughout the
visualized hepatic parenchyma. There are no aggressive appearing
lytic or blastic lesions noted in the visualized portions of the
skeleton.
IMPRESSION: 1. Small pulmonary nodules scattered throughout the lungs
bilaterally measuring 5 mm or less in size, nonspecific, but
statistically likely benign. No follow-up needed if patient is
low-risk (and has no known or suspected primary neoplasm).
Non-contrast chest CT can be considered in 12 months if patient is
high-risk. This recommendation follows the consensus statement:
Guidelines for Management of Incidental Pulmonary Nodules Detected
2. Aortic Atherosclerosis (CU4B3-WW7.7).
3. Hepatic steatosis.

## 2021-10-04 ENCOUNTER — Other Ambulatory Visit: Payer: Self-pay | Admitting: Cardiology

## 2021-10-21 DIAGNOSIS — R509 Fever, unspecified: Secondary | ICD-10-CM | POA: Diagnosis not present

## 2021-10-21 DIAGNOSIS — R051 Acute cough: Secondary | ICD-10-CM | POA: Diagnosis not present

## 2021-10-21 DIAGNOSIS — R0981 Nasal congestion: Secondary | ICD-10-CM | POA: Diagnosis not present

## 2021-10-24 DIAGNOSIS — T50995A Adverse effect of other drugs, medicaments and biological substances, initial encounter: Secondary | ICD-10-CM | POA: Diagnosis not present

## 2021-10-24 DIAGNOSIS — R509 Fever, unspecified: Secondary | ICD-10-CM | POA: Diagnosis not present

## 2021-11-30 DIAGNOSIS — R519 Headache, unspecified: Secondary | ICD-10-CM | POA: Insufficient documentation

## 2021-11-30 DIAGNOSIS — M1990 Unspecified osteoarthritis, unspecified site: Secondary | ICD-10-CM

## 2021-11-30 DIAGNOSIS — I493 Ventricular premature depolarization: Secondary | ICD-10-CM

## 2021-11-30 DIAGNOSIS — Z7729 Contact with and (suspected ) exposure to other hazardous substances: Secondary | ICD-10-CM | POA: Insufficient documentation

## 2021-11-30 DIAGNOSIS — J309 Allergic rhinitis, unspecified: Secondary | ICD-10-CM

## 2021-11-30 HISTORY — DX: Headache, unspecified: R51.9

## 2021-11-30 HISTORY — DX: Contact with and (suspected) exposure to other hazardous substances: Z77.29

## 2021-11-30 HISTORY — DX: Allergic rhinitis, unspecified: J30.9

## 2021-11-30 HISTORY — DX: Ventricular premature depolarization: I49.3

## 2021-11-30 HISTORY — DX: Unspecified osteoarthritis, unspecified site: M19.90

## 2021-12-29 ENCOUNTER — Other Ambulatory Visit: Payer: Self-pay

## 2022-01-05 ENCOUNTER — Encounter: Payer: Self-pay | Admitting: Cardiology

## 2022-01-05 ENCOUNTER — Ambulatory Visit: Payer: Medicare PPO | Attending: Cardiology | Admitting: Cardiology

## 2022-01-05 VITALS — BP 118/60 | HR 92 | Ht 72.0 in | Wt 252.2 lb

## 2022-01-05 DIAGNOSIS — I10 Essential (primary) hypertension: Secondary | ICD-10-CM

## 2022-01-05 DIAGNOSIS — E669 Obesity, unspecified: Secondary | ICD-10-CM

## 2022-01-05 DIAGNOSIS — E781 Pure hyperglyceridemia: Secondary | ICD-10-CM | POA: Diagnosis not present

## 2022-01-05 DIAGNOSIS — E1140 Type 2 diabetes mellitus with diabetic neuropathy, unspecified: Secondary | ICD-10-CM | POA: Diagnosis not present

## 2022-01-05 DIAGNOSIS — E66811 Obesity, class 1: Secondary | ICD-10-CM

## 2022-01-05 DIAGNOSIS — R931 Abnormal findings on diagnostic imaging of heart and coronary circulation: Secondary | ICD-10-CM

## 2022-01-05 NOTE — Progress Notes (Signed)
Cardiology Office Note:    Date:  01/05/2022   ID:  Christian Hall, DOB 1945/12/23, MRN [id number]  PCP:  Mateo Flow, MD  Cardiologist:  Jenean Lindau, MD   Referring MD: Mateo Flow, MD    ASSESSMENT:    1. Benign essential hypertension   2. Elevated coronary artery calcium score   3. Type 2 diabetes mellitus with diabetic neuropathy, without long-term current use of insulin (HCC)   4. Obesity (BMI 30.0-34.9)   5. Pure hypertriglyceridemia    PLAN:    In order of problems listed above:  Coronary artery disease: Secondary prevention stressed with patient.  Importance of compliance with diet medication stressed any vocalized understanding.  He was advised to walk at least half an hour a day 5 days a week and he promises to do so. Essential hypertension: Blood pressure stable and diet was emphasized. Mixed dyslipidemia: On statin therapy.  He wants to get blood work done in the next couple of weeks at the [place] and send me a copy.  I will review it when I get it and get back to him. Diabetes mellitus and obesity: Weight reduction stressed diet emphasized.Patient will be seen in follow-up appointment in 6 months or earlier if the patient has any concerns    Medication Adjustments/Labs and Tests Ordered: Current medicines are reviewed at length with the patient today.  Concerns regarding medicines are outlined above.  No orders of the defined types were placed in this encounter.  No orders of the defined types were placed in this encounter.    No chief complaint on file.    History of Present Illness:    Christian Hall is a 77 y.o. male.  Patient has past medical history of elevated coronary calcium, essential hypertension, dyslipidemia, diabetes and obesity.  He denies any problems at this time and takes care of activities of daily living.  No chest pain orthopnea or PND.  Overall he leads a sedentary lifestyle.  At the time of my evaluation, the patient is alert awake  oriented and in no distress.  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 Apr 15, 2021 Entered By: Lorelei Pont P Comment: Agent Orange Apr 15, 2021 Entered By: Lenna Gilford Comment: Agent Orange   Headache 11/30/2021   History of adenomatous polyp of colon 01/04/2000   Jul 28, 2016 Entered By: Christene Lye Comment: 2002 TA; 12.2009: TA; 7.2018 HP + tics; *Surveillance 2023   Hyperlipidemia 03/08/2017   Kidney stone 41/74/0814   Metabolic syndrome X 48/18/5631   Muscle spasm of back    Nail dystrophy 09/03/2020   Obesity (BMI 30.0-34.9) 09/09/2020   Osteoarthritis 11/30/2021   Overweight 08/29/2019   Premature ventricular contraction on electrocardiography 11/30/2021   Formatting of this note might be different from the original. 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   fenofibrate (TRICOR) 145 MG tablet Take 145 mg by mouth daily.   glimepiride (AMARYL) 4 MG tablet Take 4 mg by mouth daily with breakfast.   JANUMET XR 50-1000 MG TB24 Take 1  tablet by mouth daily.   lisinopril (ZESTRIL) 5 MG tablet Take 5 mg by mouth daily.   pioglitazone (ACTOS) 30 MG tablet Take 30 mg by mouth daily.   rosuvastatin (CRESTOR) 20 MG tablet Take 20 mg by mouth daily.   TRULICITY 1.5 AS/3.4HD SOPN Inject 0.5 mLs into the skin once a week.     Allergies:   Hydromorphone and Molnupiravir   Social History   Socioeconomic History   Marital status: Married    Spouse name: Not on file   Number of children: Not on file   Years of education: Not on file   Highest education level: Not on file  Occupational History   Not on file  Tobacco Use   Smoking status: Former   Smokeless tobacco: Never  Substance and Sexual Activity   Alcohol use: Not on file   Drug use: Not on file   Sexual activity: Not on file  Other Topics Concern   Not on file  Social History Narrative   Not on file   Social Determinants of Health   Financial Resource Strain: Not on file  Food Insecurity: Not on file  Transportation Needs: Not on file  Physical Activity: Not on file  Stress: Not on file  Social Connections: Not on file     Family History: The patient's family history includes Congestive Heart Failure in his brother; Heart attack in his brother; Hypertension in his brother and mother.  ROS:   Please see the history of present illness.    All other systems reviewed and are negative.  EKGs/Labs/Other Studies Reviewed:    The following studies were reviewed today: I discussed my findings with the patient at length   Recent Labs: No results found for requested labs within last 365 days.  Recent Lipid Panel    Component Value Date/Time   CHOL 138 09/09/2020 1414   TRIG 114 09/09/2020 1414   HDL 39 (L) 09/09/2020 1414   CHOLHDL 3.5 09/09/2020 1414   LDLCALC 78 09/09/2020 1414    Physical Exam:    VS:  BP 118/60   Pulse 92   Ht 6' (1.829 m)   Wt 252 lb 3.2 oz (114.4 kg)   SpO2 94%   BMI 34.20 kg/m     Wt Readings from Last 3 Encounters:   01/05/22 252 lb 3.2 oz (114.4 kg)  03/10/21 256 lb 3.2 oz (116.2 kg)  09/09/20 249 lb (112.9 kg)     GEN: Patient is in no acute distress HEENT: Normal NECK: No JVD; No carotid bruits LYMPHATICS: No lymphadenopathy CARDIAC: Hear sounds regular, 2/6 systolic murmur at the apex. RESPIRATORY:  Clear to auscultation without rales, wheezing or rhonchi  ABDOMEN: Soft, non-tender, non-distended MUSCULOSKELETAL:  No edema; No deformity  SKIN: Warm and dry NEUROLOGIC:  Alert and oriented x 3 PSYCHIATRIC:  Normal affect   Signed, Jenean Lindau, MD  01/05/2022 2:15 PM    [facility]

## 2022-01-05 NOTE — Patient Instructions (Signed)
Medication Instructions:  Your physician recommends that you continue on your current medications as directed. Please refer to the Current Medication list given to you today.  *If you need a refill on your cardiac medications before your next appointment, please call your pharmacy*   Lab Work: None ordered If you have labs (blood work) drawn today and your tests are completely normal, you will receive your results only by: MyChart Message (if you have MyChart) OR A paper copy in the mail If you have any lab test that is abnormal or we need to change your treatment, we will call you to review the results.   Testing/Procedures: None ordered   Follow-Up: At [facility], you and your health needs are our priority.  As part of our continuing mission to provide you with exceptional heart care, we have created designated Provider Care Teams.  These Care Teams include your primary Cardiologist (physician) and Advanced Practice Providers (APPs -  Physician Assistants and Nurse Practitioners) who all work together to provide you with the care you need, when you need it.  We recommend signing up for the patient portal called "MyChart".  Sign up information is provided on this After Visit Summary.  MyChart is used to connect with patients for Virtual Visits (Telemedicine).  Patients are able to view lab/test results, encounter notes, upcoming appointments, etc.  Non-urgent messages can be sent to your provider as well.   To learn more about what you can do with MyChart, go to [email or web address].    Your next appointment:   9 month(s)  The format for your next appointment:   In Person  Provider:   Rajan Revankar, MD    Other Instructions none  Important Information About Sugar       

## 2022-03-08 ENCOUNTER — Other Ambulatory Visit: Payer: Self-pay | Admitting: Cardiology

## 2022-03-09 DIAGNOSIS — Z125 Encounter for screening for malignant neoplasm of prostate: Secondary | ICD-10-CM | POA: Diagnosis not present

## 2022-03-09 DIAGNOSIS — N200 Calculus of kidney: Secondary | ICD-10-CM | POA: Diagnosis not present

## 2022-03-09 DIAGNOSIS — N401 Enlarged prostate with lower urinary tract symptoms: Secondary | ICD-10-CM | POA: Diagnosis not present

## 2022-04-12 DIAGNOSIS — J40 Bronchitis, not specified as acute or chronic: Secondary | ICD-10-CM | POA: Diagnosis not present

## 2022-04-12 DIAGNOSIS — J329 Chronic sinusitis, unspecified: Secondary | ICD-10-CM | POA: Diagnosis not present

## 2022-04-12 DIAGNOSIS — J302 Other seasonal allergic rhinitis: Secondary | ICD-10-CM | POA: Diagnosis not present

## 2022-06-08 DIAGNOSIS — Z6835 Body mass index (BMI) 35.0-35.9, adult: Secondary | ICD-10-CM | POA: Diagnosis not present

## 2022-06-08 DIAGNOSIS — R809 Proteinuria, unspecified: Secondary | ICD-10-CM | POA: Diagnosis not present

## 2022-06-08 DIAGNOSIS — Z9181 History of falling: Secondary | ICD-10-CM | POA: Diagnosis not present

## 2022-06-08 DIAGNOSIS — Z0000 Encounter for general adult medical examination without abnormal findings: Secondary | ICD-10-CM | POA: Diagnosis not present

## 2022-06-08 DIAGNOSIS — Z1331 Encounter for screening for depression: Secondary | ICD-10-CM | POA: Diagnosis not present

## 2022-06-08 DIAGNOSIS — Z1339 Encounter for screening examination for other mental health and behavioral disorders: Secondary | ICD-10-CM | POA: Diagnosis not present

## 2022-06-08 DIAGNOSIS — E1129 Type 2 diabetes mellitus with other diabetic kidney complication: Secondary | ICD-10-CM | POA: Diagnosis not present

## 2022-06-08 DIAGNOSIS — N183 Chronic kidney disease, stage 3 unspecified: Secondary | ICD-10-CM | POA: Diagnosis not present

## 2022-09-12 DIAGNOSIS — R310 Gross hematuria: Secondary | ICD-10-CM | POA: Diagnosis not present

## 2022-09-12 DIAGNOSIS — I878 Other specified disorders of veins: Secondary | ICD-10-CM | POA: Diagnosis not present

## 2022-09-12 DIAGNOSIS — N200 Calculus of kidney: Secondary | ICD-10-CM | POA: Diagnosis not present

## 2022-09-12 DIAGNOSIS — N401 Enlarged prostate with lower urinary tract symptoms: Secondary | ICD-10-CM | POA: Diagnosis not present

## 2022-09-28 ENCOUNTER — Other Ambulatory Visit: Payer: Self-pay | Admitting: Cardiology

## 2022-11-10 ENCOUNTER — Other Ambulatory Visit: Payer: Self-pay

## 2022-11-11 ENCOUNTER — Encounter: Payer: Self-pay | Admitting: Cardiology

## 2022-11-11 ENCOUNTER — Ambulatory Visit: Payer: Medicare PPO | Attending: Cardiology | Admitting: Cardiology

## 2022-11-11 VITALS — BP 130/60 | HR 86 | Ht 72.0 in | Wt 261.4 lb

## 2022-11-11 DIAGNOSIS — R931 Abnormal findings on diagnostic imaging of heart and coronary circulation: Secondary | ICD-10-CM | POA: Diagnosis not present

## 2022-11-11 DIAGNOSIS — E782 Mixed hyperlipidemia: Secondary | ICD-10-CM

## 2022-11-11 DIAGNOSIS — I493 Ventricular premature depolarization: Secondary | ICD-10-CM | POA: Diagnosis not present

## 2022-11-11 DIAGNOSIS — E66811 Obesity, class 1: Secondary | ICD-10-CM | POA: Diagnosis not present

## 2022-11-11 DIAGNOSIS — I10 Essential (primary) hypertension: Secondary | ICD-10-CM

## 2022-11-11 DIAGNOSIS — E1140 Type 2 diabetes mellitus with diabetic neuropathy, unspecified: Secondary | ICD-10-CM | POA: Diagnosis not present

## 2022-11-11 NOTE — Progress Notes (Signed)
Cardiology Office Note:    Date:  11/11/2022   ID:  Christian Hall, DOB 09-26-1945, MRN [id number]  PCP:  Lise Auer, MD  Cardiologist:  Garwin Brothers, MD   Referring MD: Lise Auer, MD    ASSESSMENT:    1. Mixed hyperlipidemia   2. Benign essential hypertension   3. Elevated coronary artery calcium score   4. Ventricular premature contractions   5. Type 2 diabetes mellitus with diabetic neuropathy, without long-term current use of insulin (HCC)   6. Obesity (BMI 30.0-34.9)    PLAN:    In order of problems listed above:  Coronary artery calcification: Secondary prevention stressed with the patient.  Importance of compliance with diet medications stressed and he vocalized understanding.  He was advised to walk on a regular basis and he is doing well with exercise. Essential hypertension: Blood pressure is stable and diet was emphasized.  Lifestyle modification urged.  Salt intake issues and diet emphasized. Mixed dyslipidemia: On lipid-lowering medications.  Records from the [place] reviewed with him and lipids are at goal. Obesity: Weight reduction stressed diet emphasized.  Risks of obesity explained he promises to do better. Patient will be seen in follow-up appointment in 6 months or earlier if the patient has any concerns.    Medication Adjustments/Labs and Tests Ordered: Current medicines are reviewed at length with the patient today.  Concerns regarding medicines are outlined above.  Orders Placed This Encounter  Procedures   EKG 12-Lead   No orders of the defined types were placed in this encounter.    No chief complaint on file.    History of Present Illness:    Christian Hall is a 77 y.o. male.  Patient has past medical history of coronary artery calcification, essential hypertension, mixed dyslipidemia, diabetes mellitus and obesity.  He tells me that he walks on a regular basis.  He denies any chest pain orthopnea or PND.  At the time of my evaluation,  the patient is alert awake oriented and in no distress.  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 Apr 15, 2021 Entered By: Dennard Schaumann P Comment: Agent Orange Apr 15, 2021 Entered By: Jake Bathe Comment: Agent Orange   Headache 11/30/2021   History of adenomatous polyp of colon 01/04/2000   Jul 28, 2016 Entered By: Polly Cobia Comment: 2002 TA; 12.2009: TA; 7.2018 HP + tics; *Surveillance 2023   Hyperlipidemia 03/08/2017   Kidney stone 09/03/2020   Metabolic syndrome X 09/03/2020   Muscle spasm of back    Nail dystrophy 09/03/2020   Obesity (BMI 30.0-34.9) 09/09/2020   Osteoarthritis 11/30/2021   Overweight 08/29/2019   Premature ventricular contraction on electrocardiography 11/30/2021   Formatting of this note might be different from the original. 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   Cholecalciferol (D3 5000) 125 MCG (5000 UT) capsule Take 5,000 Units by mouth daily.   CINNAMON PO Take 9,000 mg by mouth 2 (two) times daily.    Coenzyme Q10 (COQ10) 100 MG CAPS Take 100 mg by mouth daily.   fenofibrate (TRICOR) 145 MG tablet Take 145 mg by mouth daily.   glimepiride (AMARYL) 4 MG tablet Take 4 mg by mouth 2 (two) times daily.   Glucosamine-Chondroit-Vit C-Mn (GLUCOSAMINE CHONDR 1500 COMPLX PO) Take 1 tablet by mouth 2 (two) times daily.   JANUMET XR 50-1000 MG TB24 Take 1 tablet by mouth daily.   Krill Oil 350 MG CAPS Take 350 mg by mouth daily.   lisinopril (ZESTRIL) 5 MG tablet Take 5 mg by mouth daily.   Magnesium 200 MG TABS Take 200 mg by mouth daily.   Multiple Vitamin (MULTIVITAMIN) tablet Take 1 tablet by mouth daily.   pioglitazone (ACTOS) 30 MG tablet Take 30 mg by mouth daily.   rosuvastatin (CRESTOR) 20 MG tablet Take 1 tablet (20 mg total) by mouth daily.   Saw Palmetto 450 MG CAPS Take 450 mg by mouth 4 (four) times daily.   TRULICITY 1.5 MG/0.5ML SOPN Inject 0.5 mLs into the skin once a week.   [DISCONTINUED] fenofibrate (TRICOR) 145 MG tablet TAKE 1 TABLET DAILY     Allergies:   Hydromorphone and Molnupiravir   Social History   Socioeconomic History   Marital status: Married    Spouse name: Not on file   Number of children: Not on file   Years of education: Not on file   Highest education level: Not on file  Occupational History   Not on file  Tobacco Use   Smoking status: Former   Smokeless tobacco: Never  Substance and Sexual Activity   Alcohol use: Not on file   Drug use: Not on file   Sexual activity: Not on file  Other Topics Concern   Not on file  Social History Narrative   Not on file   Social Determinants of Health   Financial Resource Strain: Not on file  Food Insecurity: Not on file  Transportation Needs: Not on file  Physical Activity: Not on file  Stress: Not on file  Social Connections: Not on file     Family History: The patient's family history includes Congestive Heart Failure in his brother; Heart attack in his brother; Hypertension in his brother and  mother.  ROS:   Please see the history of present illness.    All other systems reviewed and are negative.  EKGs/Labs/Other Studies Reviewed:    The following studies were reviewed today: .Marland KitchenEKG Interpretation Date/Time:  Friday November 11 2022 16:06:50 EST Ventricular Rate:  86 PR Interval:  176 QRS Duration:  112 QT Interval:  380 QTC Calculation: 454 R Axis:   1  Text Interpretation: Sinus rhythm with frequent Premature ventricular complexes Incomplete left bundle branch block Borderline ECG When compared with ECG of 20-Sep-2005 09:21, Premature ventricular complexes are now Present ST now depressed in Inferior leads Confirmed by Belva Crome [phone]) on 11/11/2022 3:30:30 PM     Recent Labs: No results found for requested labs within last 365 days.  Recent Lipid Panel    Component Value Date/Time   CHOL 138 09/09/2020 1414   TRIG  114 09/09/2020 1414   HDL 39 (L) 09/09/2020 1414   CHOLHDL 3.5 09/09/2020 1414   LDLCALC 78 09/09/2020 1414    Physical Exam:    VS:  BP 130/60   Pulse 86   Ht 6' (1.829 m)   Wt 261 lb 6.4 oz (118.6 kg)   SpO2 94%   BMI 35.45 kg/m     Wt Readings from Last 3 Encounters:  11/11/22 261 lb 6.4 oz (118.6 kg)  01/05/22 252 lb 3.2 oz (114.4 kg)  03/10/21 256 lb 3.2 oz (116.2 kg)     GEN: Patient is in no acute distress HEENT: Normal NECK: No JVD; No carotid bruits LYMPHATICS: No lymphadenopathy CARDIAC: Hear sounds regular, 2/6 systolic murmur at the apex. RESPIRATORY:  Clear to auscultation without rales, wheezing or rhonchi  ABDOMEN: Soft, non-tender, non-distended MUSCULOSKELETAL:  No edema; No deformity  SKIN: Warm and dry NEUROLOGIC:  Alert and oriented x 3 PSYCHIATRIC:  Normal affect   Signed, Garwin Brothers, MD  11/11/2022 3:41 PM    [facility]

## 2022-11-11 NOTE — Patient Instructions (Signed)
Medication Instructions:  Your physician recommends that you continue on your current medications as directed. Please refer to the Current Medication list given to you today.  *If you need a refill on your cardiac medications before your next appointment, please call your pharmacy*   Lab Work: None Ordered If you have labs (blood work) drawn today and your tests are completely normal, you will receive your results only by: MyChart Message (if you have MyChart) OR A paper copy in the mail If you have any lab test that is abnormal or we need to change your treatment, we will call you to review the results.   Testing/Procedures: None Ordered   Follow-Up: At [facility], you and your health needs are our priority.  As part of our continuing mission to provide you with exceptional heart care, we have created designated Provider Care Teams.  These Care Teams include your primary Cardiologist (physician) and Advanced Practice Providers (APPs -  Physician Assistants and Nurse Practitioners) who all work together to provide you with the care you need, when you need it.  We recommend signing up for the patient portal called "MyChart".  Sign up information is provided on this After Visit Summary.  MyChart is used to connect with patients for Virtual Visits (Telemedicine).  Patients are able to view lab/test results, encounter notes, upcoming appointments, etc.  Non-urgent messages can be sent to your provider as well.   To learn more about what you can do with MyChart, go to [email or web address].    Your next appointment:   9 month(s)  The format for your next appointment:   In Person  Provider:   Rajan Revankar, MD    Other Instructions NA  

## 2022-12-05 ENCOUNTER — Other Ambulatory Visit: Payer: Self-pay | Admitting: Cardiology

## 2023-01-03 DIAGNOSIS — Z23 Encounter for immunization: Secondary | ICD-10-CM | POA: Diagnosis not present

## 2023-03-27 ENCOUNTER — Other Ambulatory Visit: Payer: Self-pay | Admitting: Cardiology

## 2023-04-03 DIAGNOSIS — E1129 Type 2 diabetes mellitus with other diabetic kidney complication: Secondary | ICD-10-CM | POA: Diagnosis not present

## 2023-04-03 DIAGNOSIS — Z6834 Body mass index (BMI) 34.0-34.9, adult: Secondary | ICD-10-CM | POA: Diagnosis not present

## 2023-04-03 DIAGNOSIS — I10 Essential (primary) hypertension: Secondary | ICD-10-CM | POA: Diagnosis not present

## 2023-04-03 DIAGNOSIS — R809 Proteinuria, unspecified: Secondary | ICD-10-CM | POA: Diagnosis not present

## 2023-04-03 DIAGNOSIS — E782 Mixed hyperlipidemia: Secondary | ICD-10-CM | POA: Diagnosis not present

## 2023-06-21 DIAGNOSIS — L03032 Cellulitis of left toe: Secondary | ICD-10-CM | POA: Diagnosis not present

## 2023-06-22 DIAGNOSIS — N401 Enlarged prostate with lower urinary tract symptoms: Secondary | ICD-10-CM | POA: Diagnosis not present

## 2023-06-22 DIAGNOSIS — R310 Gross hematuria: Secondary | ICD-10-CM | POA: Diagnosis not present

## 2023-06-22 DIAGNOSIS — N200 Calculus of kidney: Secondary | ICD-10-CM | POA: Diagnosis not present

## 2023-06-22 DIAGNOSIS — Z125 Encounter for screening for malignant neoplasm of prostate: Secondary | ICD-10-CM | POA: Diagnosis not present

## 2023-06-26 DIAGNOSIS — Z1331 Encounter for screening for depression: Secondary | ICD-10-CM | POA: Diagnosis not present

## 2023-06-26 DIAGNOSIS — Z1339 Encounter for screening examination for other mental health and behavioral disorders: Secondary | ICD-10-CM | POA: Diagnosis not present

## 2023-06-26 DIAGNOSIS — Z0000 Encounter for general adult medical examination without abnormal findings: Secondary | ICD-10-CM | POA: Diagnosis not present

## 2023-06-26 DIAGNOSIS — Z9181 History of falling: Secondary | ICD-10-CM | POA: Diagnosis not present

## 2023-06-26 DIAGNOSIS — S90122D Contusion of left lesser toe(s) without damage to nail, subsequent encounter: Secondary | ICD-10-CM | POA: Diagnosis not present

## 2023-06-26 DIAGNOSIS — Z6834 Body mass index (BMI) 34.0-34.9, adult: Secondary | ICD-10-CM | POA: Diagnosis not present

## 2023-10-20 DIAGNOSIS — N400 Enlarged prostate without lower urinary tract symptoms: Secondary | ICD-10-CM | POA: Diagnosis not present

## 2023-10-20 DIAGNOSIS — E119 Type 2 diabetes mellitus without complications: Secondary | ICD-10-CM | POA: Diagnosis not present

## 2023-10-20 DIAGNOSIS — E785 Hyperlipidemia, unspecified: Secondary | ICD-10-CM | POA: Diagnosis not present

## 2023-10-20 DIAGNOSIS — Z7984 Long term (current) use of oral hypoglycemic drugs: Secondary | ICD-10-CM | POA: Diagnosis not present

## 2023-10-20 DIAGNOSIS — I499 Cardiac arrhythmia, unspecified: Secondary | ICD-10-CM | POA: Diagnosis not present

## 2023-10-20 DIAGNOSIS — E669 Obesity, unspecified: Secondary | ICD-10-CM | POA: Diagnosis not present

## 2023-10-20 DIAGNOSIS — Z833 Family history of diabetes mellitus: Secondary | ICD-10-CM | POA: Diagnosis not present

## 2023-10-20 DIAGNOSIS — Z8249 Family history of ischemic heart disease and other diseases of the circulatory system: Secondary | ICD-10-CM | POA: Diagnosis not present

## 2023-10-20 DIAGNOSIS — I700 Atherosclerosis of aorta: Secondary | ICD-10-CM | POA: Diagnosis not present

## 2023-10-23 ENCOUNTER — Other Ambulatory Visit: Payer: Self-pay | Admitting: Cardiology

## 2023-12-03 ENCOUNTER — Other Ambulatory Visit: Payer: Self-pay | Admitting: Cardiology
